# Patient Record
Sex: Male | Born: 1970
Health system: Southern US, Community
[De-identification: ages and names within clinical notes are randomized; demographics above are authoritative.]

## PROBLEM LIST (undated history)

## (undated) DIAGNOSIS — T7840XA Allergy, unspecified, initial encounter: Secondary | ICD-10-CM

## (undated) DIAGNOSIS — E079 Disorder of thyroid, unspecified: Secondary | ICD-10-CM

## (undated) HISTORY — PX: NASAL SINUS SURGERY: SHX719

## (undated) HISTORY — DX: Disorder of thyroid, unspecified: E07.9

## (undated) HISTORY — DX: Allergy, unspecified, initial encounter: T78.40XA

---

## 2006-11-19 ENCOUNTER — Ambulatory Visit: Payer: Self-pay | Admitting: Internal Medicine

## 2007-01-07 ENCOUNTER — Encounter: Admission: RE | Admit: 2007-01-07 | Discharge: 2007-01-07 | Payer: Self-pay | Admitting: Orthopedic Surgery

## 2007-08-10 ENCOUNTER — Ambulatory Visit: Payer: Self-pay | Admitting: Internal Medicine

## 2007-08-31 DIAGNOSIS — E785 Hyperlipidemia, unspecified: Secondary | ICD-10-CM | POA: Insufficient documentation

## 2007-08-31 DIAGNOSIS — Z872 Personal history of diseases of the skin and subcutaneous tissue: Secondary | ICD-10-CM | POA: Insufficient documentation

## 2008-11-23 ENCOUNTER — Ambulatory Visit: Payer: Self-pay | Admitting: Vascular Surgery

## 2010-07-03 ENCOUNTER — Ambulatory Visit
Admission: RE | Admit: 2010-07-03 | Discharge: 2010-07-03 | Disposition: A | Discharge status: Home / Self Care | Payer: BC Managed Care – PPO | Source: Ambulatory Visit | Attending: Endocrinology | Admitting: Endocrinology

## 2010-07-03 ENCOUNTER — Other Ambulatory Visit: Payer: Self-pay | Admitting: Endocrinology

## 2010-09-23 NOTE — Assessment & Plan Note (Signed)
[facility]                         GASTROENTEROLOGY OFFICE NOTE   NAME:Corey Simmons, Corey Simmons                        MRN:          [id number]  DATE:11/19/2006                            DOB:          [birth year]    The patient is self referred.   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  First, the patient states  developing significant problems with regurgitation and substernal  burning about 5 years ago.  For this he was placed on proton pump  inhibitors without improvement.  Subsequently, he took Zantac 300 mg  daily.  This was helpful.  He has subsequently weaned himself to 75 mg  daily over the past 2 years.  He has absolutely no heartburn,  indigestion, or dysphagia.  He is wondering if he could come off the  medication.  No family history of esophageal disorders or esophageal  cancer.  No abdominal pain.  Second, the patient reports about 6 weeks  ago passing a hard bowel movement.  Thereafter, severe rectal pain and  some bleeding.  The pain lasted several days.  It made it difficult to  sit.  He subsequently began to treat himself with Preparation-H, medical  wipes, and sitz baths, as well AnaMantle cream.  Since that time, he has  had gradual improvement in symptoms.  There are still some mild  intermittent symptoms.  He is wondering what to do.   PAST MEDICAL HISTORY:  Dyslipidemia.   PAST SURGICAL HISTORY:  None.   ALLERGIES:  No known drug allergies.   CURRENT MEDICATIONS:  1. Zantac 75 mg daily.  2. Fish oil supplement.  3. AnaMantle cream.  4. Claritin-D p.r.n.   FAMILY HISTORY:  Father with colon polyps diagnosed in his mid 61s.  Father also with a history of heart disease.   SOCIAL HISTORY:  The patient is married with a 23.54-year-old son.  He  lives with is wife.  He has a Probation officer.  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  He does not smoke.  He  occasionally uses alcohol.   REVIEW OF SYSTEMS:  Per diagnostic evaluation form.   PHYSICAL EXAMINATION:  GENERAL:  A well-appearing male in no acute  distress.  VITAL SIGNS:  Blood pressure is 114/72, heart rate 66, weight is 179  pounds.  He is 6 feet 1 inch in height.  HEENT:  Sclerae are anicteric.  Conjunctivae are pink.  Oral mucosa  intact.  No adenopathy.  LUNGS:  Clear.  HEART:  Regular.  ABDOMEN:  Soft without tenderness, mass, or hernia.  RECTAL:  Reveals no external abnormalities.  No hemorrhoids.  He does  have a posterior fissure.  EXTREMITIES:  Without edema.   IMPRESSION:  1. Possible prior history of reflux disease.  Currently asymptomatic      on low dose Zantac.  2. Recent problems with rectal pain and bleeding due to posterior anal      fissure.   RECOMMENDATIONS:  1. The patient may discontinue 75 mg of Zantac and use the medication      on demand should he have breakthrough symptoms, otherwise adherence      to reflux precautions would be in order.  2. For his fissure, he should continue with local therapies until      asymptomatic for one week then discontinue.  In the interim though,      I have asked him to take a daily fiber supplement such as Metamucil      to improve the consistency of stools and hopefully avoid re-      aggravation of the fissure in the future.     Wilhemina Bonito. Marina Goodell, MD  Electronically Signed    JNP/MedQ  DD: 11/19/2006  DT: 11/19/2006  Job #: [id number]   cc:   Kari Baars, M.D.

## 2010-09-23 NOTE — Assessment & Plan Note (Signed)
[facility]                         GASTROENTEROLOGY OFFICE NOTE   NAME:[name], [name]                        MRN:          [id number]  DATE:08/10/2007                            DOB:          1970-07-10    HISTORY:  Corey Simmons presents today regarding rectal bleeding.  He was  evaluated in the office initially November 19, 2006 for rectal pain and  bleeding.  He was found to have an obvious posterior anal fissure.  He  was treated medically and his problem resolved.  However, about two  months ago he became constipated, passed a hard stool and developed  recurrent bleeding.  He had fairly regular bleeding with bowel movements  and then resumed medical therapy including sitz baths and hemorrhoid  cream.  As well, increased his oral fiber intake.  Currently he is only  noticing trivial amounts of blood on the tissue.  No pain.  No other  issues.  He felt compelled to have this problem evaluated.   PHYSICAL EXAMINATION:  Well appearing male in no acute distress.  Blood  pressure is 120/74, heart rate is 68, weight is 179 pounds.  His abdomen  is entirely benign.  Rectal exam reveals obvious posterior fissure.   IMPRESSION:  Current rectal bleeding due to posterior anal fissure.   RECOMMENDATIONS:  1. Continue fiber.  2. Continue local therapies.  3. If problem were to become recurrent with great frequency or      persistent, then consider surgical therapy.     Wilhemina Bonito. Marina Goodell, MD  Electronically Signed    JNP/MedQ  DD: 08/10/2007  DT: 08/10/2007  Job #: [id number]

## 2010-09-23 NOTE — Procedures (Signed)
DUPLEX DEEP VENOUS EXAM - LOWER EXTREMITY   INDICATION:  Bilateral calf pain.   HISTORY:  Edema:  No.  Trauma/Surgery:  No.  Pain:  Yes.  PE:  No.  Previous DVT:  None.  Anticoagulants:  None.  Other:   DUPLEX EXAM:                CFV   SFV   PopV  PTV    GSV                R  L  R  L  R  L  R   L  R  L  Thrombosis    0  0  0  0  0  0  0   0  0  0  Spontaneous   +  +  +  +  +  +  +   +  +  +  Phasic        +  +  +  +  +  +  +   +  +  +  Augmentation  +  +  +  +  +  +  +   +  +  +  Compressible  +  +  +  +  +  +  +   +  +  +  Competent     +  +  +  +  +  +  +   +  +  +   Legend:  + - yes  o - no  p - partial  D - decreased   IMPRESSION:  1. No evidence of DVT noted in bilateral legs.  2. Thrombus noted in the left lesser saphenous vein.  3. Notify Dr. Clelia Croft with results.    _____________________________  Di Kindle. Edilia Bo, M.D.   MG/MEDQ  D:  11/23/2008  T:  11/23/2008  Job:  [id number]

## 2015-06-01 ENCOUNTER — Encounter (HOSPITAL_COMMUNITY): Payer: Self-pay | Admitting: *Deleted

## 2015-06-01 ENCOUNTER — Emergency Department (HOSPITAL_COMMUNITY)
Admission: EM | Admit: 2015-06-01 | Discharge: 2015-06-01 | Disposition: A | Discharge status: Home / Self Care | Payer: BLUE CROSS/BLUE SHIELD | Source: Home / Self Care

## 2015-06-01 DIAGNOSIS — T700XXA Otitic barotrauma, initial encounter: Secondary | ICD-10-CM | POA: Diagnosis not present

## 2015-06-01 DIAGNOSIS — R0981 Nasal congestion: Secondary | ICD-10-CM

## 2015-06-01 MED ORDER — CEFDINIR 300 MG PO CAPS: 300.0000 mg | ORAL_CAPSULE | Freq: Two times a day (BID) | ORAL | Status: DC

## 2015-06-01 MED ORDER — LORATADINE-PSEUDOEPHEDRINE ER 10-240 MG PO TB24: 1.0000 | ORAL_TABLET | Freq: Every day | ORAL | Status: AC

## 2015-06-01 MED ORDER — PREDNISONE 50 MG PO TABS: 50.0000 mg | ORAL_TABLET | Freq: Every day | ORAL | Status: DC

## 2015-06-01 NOTE — ED Provider Notes (Signed)
CSN: [id number]     Arrival date & time 06/01/15  1740 History   None    Chief Complaint  Patient presents with  . URI   HPI  Patient is a 45 year old gentleman with past history of chronic sinusitis. He has been traveling a lot, and is at increased congestion and drainage for about the last week. He flew into the area last evening from [place], and has discomfort and a blocked sensation in his right ear, cannot hear out of it. He is very uncomfortable. He is supposed to fly out tomorrow on a transatlantic multi-country trip.  History reviewed. No pertinent past medical history. History reviewed. No pertinent past surgical history. History reviewed. No pertinent family history. Social History  Substance Use Topics  . Smoking status: None  . Smokeless tobacco: None  . Alcohol Use: None    Review of Systems  All other systems reviewed and are negative.   Allergies  Review of patient's allergies indicates no known allergies.  Home Medications   Prior to Admission medications   Medication Sig Start Date End Date Taking? Authorizing Provider  Fexofenadine HCl (ALLEGRA PO) Take by mouth.   Yes Historical Provider, MD  Levothyroxine Sodium (SYNTHROID PO) Take by mouth.   Yes Historical Provider, MD  Montelukast Sodium (SINGULAIR PO) Take by mouth.   Yes Historical Provider, MD  cefdinir (OMNICEF) 300 MG capsule Take 1 capsule (300 mg total) by mouth 2 (two) times daily. 06/01/15   Sherlene Shams, MD  loratadine-pseudoephedrine (CLARITIN-D 24 HOUR) 10-240 MG 24 hr tablet Take 1 tablet by mouth daily. 06/01/15 06/11/15  Sherlene Shams, MD  predniSONE (DELTASONE) 50 MG tablet Take 1 tablet (50 mg total) by mouth daily. 06/01/15   Sherlene Shams, MD   Meds Ordered and Administered this Visit  Medications - No data to display  BP 133/90 mmHg  Pulse 71  Temp(Src) 98.2 F (36.8 C) (Oral)  SpO2 98% No data found.   Physical Exam  Constitutional: He is oriented to person, place, and  time. No distress.  Alert, nicely groomed Voice sounds congested  HENT:  Head: Atraumatic.  Right TM is translucent but moderately erythematous, Left TM is quite dull Moderate nasal congestion with mucopurulent material present Throat is red  Eyes:  Conjugate gaze, no eye redness/drainage  Neck: Neck supple.  Cardiovascular: Normal rate and regular rhythm.   Pulmonary/Chest: No respiratory distress. He has no wheezes. He has no rales.  Lungs clear, symmetric breath sounds  Abdominal: He exhibits no distension.  Musculoskeletal: Normal range of motion.  Neurological: He is alert and oriented to person, place, and time.  Skin: Skin is warm and dry.  No cyanosis  Nursing note and vitals reviewed.   ED Course  Procedures (including critical care time)  None  MDM   1. Barotrauma, otic, initial encounter   2. Sinus congestion    New Prescriptions   CEFDINIR (OMNICEF) 300 MG CAPSULE    Take 1 capsule (300 mg total) by mouth 2 (two) times daily.   LORATADINE-PSEUDOEPHEDRINE (CLARITIN-D 24 HOUR) 10-240 MG 24 HR TABLET    Take 1 tablet by mouth daily.   PREDNISONE (DELTASONE) 50 MG TABLET    Take 1 tablet (50 mg total) by mouth daily.   Trial of prednisone, Claritin-D, Afrin. If not improving in the morning, consider consulting with ENT coverage before departure.  Prescription for Omnicef given in case he is able to travel, and sinus drainage and congestion worsen.  Sherlene Shams, MD 06/01/15 [phone]

## 2015-06-01 NOTE — ED Notes (Signed)
Pt  Reports     He     Has     Sinus   Congestion     And drainage     Recent     Air  Travel   He  Reports  Has   Sensation of  r  Ear  Being  Blocked

## 2015-06-01 NOTE — Discharge Instructions (Signed)
Prescriptions for prednisone, claritin-D, and omnicef were printed. If not improved in the morning, consider a phone consult with your ENT coverage before traveling. If you are traveling, afrin before take-off may be helpful.  Ear Barotrauma  Ear barotrauma is injury to the eardrum that is caused by a pressure difference between the inside and the outside of the eardrum. CAUSES  The pressure difference that causes ear barotrauma can result from various things, including:  Flying in an airplane.  Coming to the surface too quickly after scuba diving.  Going to higher altitudes quickly.  Having an ear infection.  Being too close to an explosion or blast.  Receiving a hard hit to your ear.  Undergoing rapid depressurization after working in a pressurized chamber.  Having inflammation of your middle ear (barotitis media) caused by a blockage of the auditory tube (eustachian tube), which leads from the back of your nose to your eardrum. The blockage can result from a cold, an environmental allergy, or an upper respiratory infection. SIGNS AND SYMPTOMS  Feeling that your ear is clogged.  Dizziness that feels like a spinning, rocking, or tumbling sensation.  Loss of balance.  Nausea.  Ringing in your ears.  Ear pain.  Bleeding from your ears.  Sudden partial or complete loss of hearing.  Headache. DIAGNOSIS Your health care provider may know what is wrong based on your medical history and your recent activity. A physical exam will be done, including a close exam of your ear. Blood tests and imaging tests may be done to check for other injuries. Hearing and balance tests may also be done. This may be more important if the ear barotrauma happened while scuba diving or if you traveled to a high altitude after diving. TREATMENT Treatment depends on the severity of the injury to your ear or ears. Home care techniques may be recommended to help equalize pressure changes. These may  include chewing gum or "popping" your ears. A decongestant may be used to help reduce congestion. If there is a risk for infection, you may be given antibiotic medicine. With severe injuries, you may need to see a specialist. Surgery is sometimes needed, but this is rare. HOME CARE INSTRUCTIONS  Take medicines only as directed by your health care provider.  Do not do any of the following until your health care provider approves:  Travel to high altitudes.  Work in a Pension scheme manager or room.  Scuba dive.  Use techniques to help equalize pressure changes in your ear as recommended by your health care provider. These may include:  Chewing gum.  Frequent, forceful swallowing.  Holding your nose and gently blowing to pop your ears.  Keep your ears dry. Use the corner of a towel to get water out of your ears after showering or bathing.  Keep all follow-up visits as directed by your health care provider. This is important. SEEK MEDICAL CARE IF:  Your symptoms do not improve, or they become worse.  You have a fever.  Your symptoms involve only one ear. SEEK IMMEDIATE MEDICAL CARE IF:  You develop a severe headache, dizziness, or severe ear pain.  You have bloody or puslike drainage from your ears.  You have hearing loss.  Your outer ear becomes red or swollen, or there is swelling behind your earlobe.   This information is not intended to replace advice given to you by your health care provider. Make sure you discuss any questions you have with your health care provider.  Document Released: 03/30/2006 Document Revised: 05/18/2014 Document Reviewed: 12/11/2013 Elsevier Interactive Patient Education [employer].

## 2015-07-04 ENCOUNTER — Emergency Department (HOSPITAL_COMMUNITY)
Admission: EM | Admit: 2015-07-04 | Discharge: 2015-07-04 | Disposition: A | Discharge status: Home / Self Care | Payer: Managed Care, Other (non HMO) | Source: Home / Self Care | Attending: Emergency Medicine | Admitting: Emergency Medicine

## 2015-07-04 ENCOUNTER — Encounter (HOSPITAL_COMMUNITY): Payer: Self-pay | Admitting: Emergency Medicine

## 2015-07-04 DIAGNOSIS — J018 Other acute sinusitis: Secondary | ICD-10-CM | POA: Diagnosis not present

## 2015-07-04 MED ORDER — PREDNISONE 50 MG PO TABS: ORAL_TABLET | ORAL | Status: DC

## 2015-07-04 MED ORDER — CEFDINIR 300 MG PO CAPS: 300.0000 mg | ORAL_CAPSULE | Freq: Two times a day (BID) | ORAL | Status: DC

## 2015-07-04 NOTE — ED Notes (Signed)
Pt was seen one month ago for ear issues.  He was instructed not to fly, and so did not go to [place].  He is scheduled to fly to [place] tomorrow and would like to be sure he is safe to fly.  Pt reports some post nasal drip and is concerned for the ear.

## 2015-07-04 NOTE — Discharge Instructions (Signed)
Right now, everything looks good. Continue your allergy medications as well as the Sudafed and Mucinex. We'll add a short course of prednisone to help with the air travel. Only take the antibiotic if you develop fevers or purulent drainage. Follow-up as needed.

## 2015-07-04 NOTE — ED Provider Notes (Signed)
CSN: [id number]     Arrival date & time 07/04/15  1348 History   First MD Initiated Contact with Patient 07/04/15 1511     Chief Complaint  Patient presents with  . Follow-up   (Consider location/radiation/quality/duration/timing/severity/associated sxs/prior Treatment) HPI He is a 45 year old man here for sinus issues. He states he has chronic sinus issues. About a month ago, he was here for purulent sinus drainage with ear pain. At that time is recommended he not travel by air due to potential barotrauma. He took the medications as prescribed and states his symptoms resolved. 2 days ago, he developed a little bit of increased postnasal drainage and nasal congestion. He started taking Sudafed and Mucinex with some improvement. He is here today to check his years as he is scheduled for a transatlantic flight tomorrow. Currently denies any ear pain or difficulty hearing.  He does take Allegra and Singulair daily for sinuses. He also does sinus rinses twice a day.  History reviewed. No pertinent past medical history. History reviewed. No pertinent past surgical history. History reviewed. No pertinent family history. Social History  Substance Use Topics  . Smoking status: Never Smoker   . Smokeless tobacco: None  . Alcohol Use: No    Review of Systems As in history of present illness Allergies  Review of patient's allergies indicates no known allergies.  Home Medications   Prior to Admission medications   Medication Sig Start Date End Date Taking? Authorizing Provider  Fexofenadine HCl (ALLEGRA PO) Take by mouth.   Yes Historical Provider, MD  guaiFENesin (MUCINEX) 600 MG 12 hr tablet Take by mouth 2 (two) times daily.   Yes Historical Provider, MD  Levothyroxine Sodium (SYNTHROID PO) Take by mouth.   Yes Historical Provider, MD  Montelukast Sodium (SINGULAIR PO) Take by mouth.   Yes Historical Provider, MD  phenylephrine (SUDAFED PE) 10 MG TABS tablet Take 10 mg by mouth every 4  (four) hours as needed.   Yes Historical Provider, MD  cefdinir (OMNICEF) 300 MG capsule Take 1 capsule (300 mg total) by mouth 2 (two) times daily. 07/04/15   Melony Overly, MD  predniSONE (DELTASONE) 50 MG tablet Take 1 pill daily for 5 days. 07/04/15   Melony Overly, MD   Meds Ordered and Administered this Visit  Medications - No data to display  BP 114/74 mmHg  Pulse 62  Temp(Src) 98.2 F (36.8 C) (Oral)  Resp 12  SpO2 100% No data found.   Physical Exam  Constitutional: He is oriented to person, place, and time. He appears well-developed and well-nourished.  HENT:  Nose: Nose normal.  Mouth/Throat: No oropharyngeal exudate.  Small amount of clear postnasal drainage. TMs normal bilaterally.  Neck: Neck supple.  Cardiovascular: Normal rate.   Pulmonary/Chest: Effort normal.  Lymphadenopathy:    He has no cervical adenopathy.  Neurological: He is alert and oriented to person, place, and time.    ED Course  Procedures (including critical care time)  Labs Review Labs Reviewed - No data to display  Imaging Review No results found.    MDM   1. Other subacute sinusitis    Recommended that he continue with his typical sinus regimen as well as the Sudafed and Mucinex while he is traveling. We'll give a five-day course of prednisone as he is traveling by air. Prescription given for Omnicef to be filled if he develops fevers or purulent sinus drainage. Follow-up as needed.    Melony Overly, MD 07/04/15 [phone]

## 2017-04-23 DIAGNOSIS — J320 Chronic maxillary sinusitis: Secondary | ICD-10-CM | POA: Insufficient documentation

## 2018-03-07 DIAGNOSIS — R82998 Other abnormal findings in urine: Secondary | ICD-10-CM | POA: Diagnosis not present

## 2018-03-07 DIAGNOSIS — Z0000 Encounter for general adult medical examination without abnormal findings: Secondary | ICD-10-CM | POA: Diagnosis not present

## 2018-03-07 DIAGNOSIS — E038 Other specified hypothyroidism: Secondary | ICD-10-CM | POA: Diagnosis not present

## 2018-03-07 DIAGNOSIS — Z125 Encounter for screening for malignant neoplasm of prostate: Secondary | ICD-10-CM | POA: Diagnosis not present

## 2018-03-14 DIAGNOSIS — J302 Other seasonal allergic rhinitis: Secondary | ICD-10-CM | POA: Diagnosis not present

## 2018-03-14 DIAGNOSIS — E038 Other specified hypothyroidism: Secondary | ICD-10-CM | POA: Diagnosis not present

## 2018-03-14 DIAGNOSIS — J328 Other chronic sinusitis: Secondary | ICD-10-CM | POA: Diagnosis not present

## 2018-03-14 DIAGNOSIS — Z1331 Encounter for screening for depression: Secondary | ICD-10-CM | POA: Diagnosis not present

## 2018-03-14 DIAGNOSIS — E7849 Other hyperlipidemia: Secondary | ICD-10-CM | POA: Diagnosis not present

## 2018-03-14 DIAGNOSIS — Z0000 Encounter for general adult medical examination without abnormal findings: Secondary | ICD-10-CM | POA: Diagnosis not present

## 2018-03-15 ENCOUNTER — Other Ambulatory Visit: Payer: Self-pay | Admitting: Internal Medicine

## 2018-03-15 DIAGNOSIS — E785 Hyperlipidemia, unspecified: Secondary | ICD-10-CM

## 2018-03-21 ENCOUNTER — Ambulatory Visit
Admission: RE | Admit: 2018-03-21 | Discharge: 2018-03-21 | Disposition: A | Discharge status: Home / Self Care | Payer: Managed Care, Other (non HMO) | Source: Ambulatory Visit | Attending: Internal Medicine | Admitting: Internal Medicine

## 2018-03-21 DIAGNOSIS — E785 Hyperlipidemia, unspecified: Secondary | ICD-10-CM

## 2018-03-25 DIAGNOSIS — Z1212 Encounter for screening for malignant neoplasm of rectum: Secondary | ICD-10-CM | POA: Diagnosis not present

## 2018-05-20 DIAGNOSIS — J3489 Other specified disorders of nose and nasal sinuses: Secondary | ICD-10-CM | POA: Diagnosis not present

## 2018-05-20 DIAGNOSIS — M791 Myalgia, unspecified site: Secondary | ICD-10-CM | POA: Diagnosis not present

## 2018-05-20 DIAGNOSIS — R0982 Postnasal drip: Secondary | ICD-10-CM | POA: Diagnosis not present

## 2018-05-20 DIAGNOSIS — J343 Hypertrophy of nasal turbinates: Secondary | ICD-10-CM | POA: Diagnosis not present

## 2018-05-26 DIAGNOSIS — Z021 Encounter for pre-employment examination: Secondary | ICD-10-CM | POA: Diagnosis not present

## 2018-08-17 ENCOUNTER — Encounter: Payer: Self-pay | Admitting: Podiatry

## 2018-08-17 ENCOUNTER — Ambulatory Visit: Payer: BLUE CROSS/BLUE SHIELD | Admitting: Podiatry

## 2018-08-17 ENCOUNTER — Other Ambulatory Visit: Payer: Self-pay | Admitting: Podiatry

## 2018-08-17 ENCOUNTER — Other Ambulatory Visit: Payer: Self-pay

## 2018-08-17 ENCOUNTER — Ambulatory Visit (INDEPENDENT_AMBULATORY_CARE_PROVIDER_SITE_OTHER): Payer: BLUE CROSS/BLUE SHIELD

## 2018-08-17 VITALS — BP 112/76 | HR 68 | Temp 97.3°F | Resp 16

## 2018-08-17 DIAGNOSIS — M7662 Achilles tendinitis, left leg: Secondary | ICD-10-CM

## 2018-08-17 DIAGNOSIS — M79672 Pain in left foot: Secondary | ICD-10-CM

## 2018-08-17 DIAGNOSIS — J0140 Acute pansinusitis, unspecified: Secondary | ICD-10-CM | POA: Insufficient documentation

## 2018-08-17 DIAGNOSIS — M722 Plantar fascial fibromatosis: Secondary | ICD-10-CM

## 2018-08-17 MED ORDER — TRIAMCINOLONE ACETONIDE 10 MG/ML IJ SUSP
10.0000 mg | Freq: Once | INTRAMUSCULAR | Status: AC
  Administered 2018-08-17: 10 mg

## 2018-08-17 NOTE — Patient Instructions (Addendum)
Plantar Fasciitis (Heel Spur Syndrome) with Rehab The plantar fascia is a fibrous, ligament-like, soft-tissue structure that spans the bottom of the foot. Plantar fasciitis is a condition that causes pain in the foot due to inflammation of the tissue. SYMPTOMS   Pain and tenderness on the underneath side of the foot.  Pain that worsens with standing or walking. CAUSES  Plantar fasciitis is caused by irritation and injury to the plantar fascia on the underneath side of the foot. Common mechanisms of injury include:  Direct trauma to bottom of the foot.  Damage to a small nerve that runs under the foot where the main fascia attaches to the heel bone.  Stress placed on the plantar fascia due to bone spurs. RISK INCREASES WITH:   Activities that place stress on the plantar fascia (running, jumping, pivoting, or cutting).  Poor strength and flexibility.  Improperly fitted shoes.  Tight calf muscles.  Flat feet.  Failure to warm-up properly before activity.  Obesity. PREVENTION  Warm up and stretch properly before activity.  Allow for adequate recovery between workouts.  Maintain physical fitness:  Strength, flexibility, and endurance.  Cardiovascular fitness.  Maintain a health body weight.  Avoid stress on the plantar fascia.  Wear properly fitted shoes, including arch supports for individuals who have flat feet.  PROGNOSIS  If treated properly, then the symptoms of plantar fasciitis usually resolve without surgery. However, occasionally surgery is necessary.  RELATED COMPLICATIONS   Recurrent symptoms that may result in a chronic condition.  Problems of the lower back that are caused by compensating for the injury, such as limping.  Pain or weakness of the foot during push-off following surgery.  Chronic inflammation, scarring, and partial or complete fascia tear, occurring more often from repeated injections.  TREATMENT  Treatment initially involves the  use of ice and medication to help reduce pain and inflammation. The use of strengthening and stretching exercises may help reduce pain with activity, especially stretches of the Achilles tendon. These exercises may be performed at home or with a therapist. Your caregiver may recommend that you use heel cups of arch supports to help reduce stress on the plantar fascia. Occasionally, corticosteroid injections are given to reduce inflammation. If symptoms persist for greater than 6 months despite non-surgical (conservative), then surgery may be recommended.   MEDICATION   If pain medication is necessary, then nonsteroidal anti-inflammatory medications, such as aspirin and ibuprofen, or other minor pain relievers, such as acetaminophen, are often recommended.  Do not take pain medication within 7 days before surgery.  Prescription pain relievers may be given if deemed necessary by your caregiver. Use only as directed and only as much as you need.  Corticosteroid injections may be given by your caregiver. These injections should be reserved for the most serious cases, because they may only be given a certain number of times.  HEAT AND COLD  Cold treatment (icing) relieves pain and reduces inflammation. Cold treatment should be applied for 10 to 15 minutes every 2 to 3 hours for inflammation and pain and immediately after any activity that aggravates your symptoms. Use ice packs or massage the area with a piece of ice (ice massage).  Heat treatment may be used prior to performing the stretching and strengthening activities prescribed by your caregiver, physical therapist, or athletic trainer. Use a heat pack or soak the injury in warm water.  SEEK IMMEDIATE MEDICAL CARE IF:  Treatment seems to offer no benefit, or the condition worsens.  Any medications   produce adverse side effects.  EXERCISES- RANGE OF MOTION (ROM) AND STRETCHING EXERCISES - Plantar Fasciitis (Heel Spur Syndrome) These exercises  may help you when beginning to rehabilitate your injury. Your symptoms may resolve with or without further involvement from your physician, physical therapist or athletic trainer. While completing these exercises, remember:   Restoring tissue flexibility helps normal motion to return to the joints. This allows healthier, less painful movement and activity.  An effective stretch should be held for at least 30 seconds.  A stretch should never be painful. You should only feel a gentle lengthening or release in the stretched tissue.  RANGE OF MOTION - Toe Extension, Flexion  Sit with your right / left leg crossed over your opposite knee.  Grasp your toes and gently pull them back toward the top of your foot. You should feel a stretch on the bottom of your toes and/or foot.  Hold this stretch for 10 seconds.  Now, gently pull your toes toward the bottom of your foot. You should feel a stretch on the top of your toes and or foot.  Hold this stretch for 10 seconds. Repeat  times. Complete this stretch 3 times per day.   RANGE OF MOTION - Ankle Dorsiflexion, Active Assisted  Remove shoes and sit on a chair that is preferably not on a carpeted surface.  Place right / left foot under knee. Extend your opposite leg for support.  Keeping your heel down, slide your right / left foot back toward the chair until you feel a stretch at your ankle or calf. If you do not feel a stretch, slide your bottom forward to the edge of the chair, while still keeping your heel down.  Hold this stretch for 10 seconds. Repeat 3 times. Complete this stretch 2 times per day.   STRETCH  Gastroc, Standing  Place hands on wall.  Extend right / left leg, keeping the front knee somewhat bent.  Slightly point your toes inward on your back foot.  Keeping your right / left heel on the floor and your knee straight, shift your weight toward the wall, not allowing your back to arch.  You should feel a gentle stretch  in the right / left calf. Hold this position for 10 seconds. Repeat 3 times. Complete this stretch 2 times per day.  STRETCH  Soleus, Standing  Place hands on wall.  Extend right / left leg, keeping the other knee somewhat bent.  Slightly point your toes inward on your back foot.  Keep your right / left heel on the floor, bend your back knee, and slightly shift your weight over the back leg so that you feel a gentle stretch deep in your back calf.  Hold this position for 10 seconds. Repeat 3 times. Complete this stretch 2 times per day.  STRETCH  Gastrocsoleus, Standing  Note: This exercise can place a lot of stress on your foot and ankle. Please complete this exercise only if specifically instructed by your caregiver.   Place the ball of your right / left foot on a step, keeping your other foot firmly on the same step.  Hold on to the wall or a rail for balance.  Slowly lift your other foot, allowing your body weight to press your heel down over the edge of the step.  You should feel a stretch in your right / left calf.  Hold this position for 10 seconds.  Repeat this exercise with a slight bend in your right /   left knee. Repeat 3 times. Complete this stretch 2 times per day.   STRENGTHENING EXERCISES - Plantar Fasciitis (Heel Spur Syndrome)  These exercises may help you when beginning to rehabilitate your injury. They may resolve your symptoms with or without further involvement from your physician, physical therapist or athletic trainer. While completing these exercises, remember:   Muscles can gain both the endurance and the strength needed for everyday activities through controlled exercises.  Complete these exercises as instructed by your physician, physical therapist or athletic trainer. Progress the resistance and repetitions only as guided.  STRENGTH - Towel Curls  Sit in a chair positioned on a non-carpeted surface.  Place your foot on a towel, keeping your heel  on the floor.  Pull the towel toward your heel by only curling your toes. Keep your heel on the floor. Repeat 3 times. Complete this exercise 2 times per day.  STRENGTH - Ankle Inversion  Secure one end of a rubber exercise band/tubing to a fixed object (table, pole). Loop the other end around your foot just before your toes.  Place your fists between your knees. This will focus your strengthening at your ankle.  Slowly, pull your big toe up and in, making sure the band/tubing is positioned to resist the entire motion.  Hold this position for 10 seconds.  Have your muscles resist the band/tubing as it slowly pulls your foot back to the starting position. Repeat 3 times. Complete this exercises 2 times per day.  Document Released: 04/27/2005 Document Revised: 07/20/2011 Document Reviewed: 08/09/2008 [employer] Patient Information 2014 [employer]. Achilles Tendinitis  with Rehab Achilles tendinitis is a disorder of the Achilles tendon. The Achilles tendon connects the large calf muscles (Gastrocnemius and Soleus) to the heel bone (calcaneus). This tendon is sometimes called the heel cord. It is important for pushing-off and standing on your toes and is important for walking, running, or jumping. Tendinitis is often caused by overuse and repetitive microtrauma. SYMPTOMS  Pain, tenderness, swelling, warmth, and redness may occur over the Achilles tendon even at rest.  Pain with pushing off, or flexing or extending the ankle.  Pain that is worsened after or during activity. CAUSES   Overuse sometimes seen with rapid increase in exercise programs or in sports requiring running and jumping.  Poor physical conditioning (strength and flexibility or endurance).  Running sports, especially training running down hills.  Inadequate warm-up before practice or play or failure to stretch before participation.  Injury to the tendon. PREVENTION   Warm up and stretch before practice or  competition.  Allow time for adequate rest and recovery between practices and competition.  Keep up conditioning.  Keep up ankle and leg flexibility.  Improve or keep muscle strength and endurance.  Improve cardiovascular fitness.  Use proper technique.  Use proper equipment (shoes, skates).  To help prevent recurrence, taping, protective strapping, or an adhesive bandage may be recommended for several weeks after healing is complete. PROGNOSIS   Recovery may take weeks to several months to heal.  Longer recovery is expected if symptoms have been prolonged.  Recovery is usually quicker if the inflammation is due to a direct blow as compared with overuse or sudden strain. RELATED COMPLICATIONS   Healing time will be prolonged if the condition is not correctly treated. The injury must be given plenty of time to heal.  Symptoms can reoccur if activity is resumed too soon.  Untreated, tendinitis may increase the risk of tendon rupture requiring additional time for recovery   and possibly surgery. TREATMENT   The first treatment consists of rest anti-inflammatory medication, and ice to relieve the pain.  Stretching and strengthening exercises after resolution of pain will likely help reduce the risk of recurrence. Referral to a physical therapist or athletic trainer for further evaluation and treatment may be helpful.  A walking boot or cast may be recommended to rest the Achilles tendon. This can help break the cycle of inflammation and microtrauma.  Arch supports (orthotics) may be prescribed or recommended by your caregiver as an adjunct to therapy and rest.  Surgery to remove the inflamed tendon lining or degenerated tendon tissue is rarely necessary and has shown less than predictable results. MEDICATION   Nonsteroidal anti-inflammatory medications, such as aspirin and ibuprofen, may be used for pain and inflammation relief. Do not take within 7 days before surgery. Take  these as directed by your caregiver. Contact your caregiver immediately if any bleeding, stomach upset, or signs of allergic reaction occur. Other minor pain relievers, such as acetaminophen, may also be used.  Pain relievers may be prescribed as necessary by your caregiver. Do not take prescription pain medication for longer than 4 to 7 days. Use only as directed and only as much as you need.  Cortisone injections are rarely indicated. Cortisone injections may weaken tendons and predispose to rupture. It is better to give the condition more time to heal than to use them. HEAT AND COLD  Cold is used to relieve pain and reduce inflammation for acute and chronic Achilles tendinitis. Cold should be applied for 10 to 15 minutes every 2 to 3 hours for inflammation and pain and immediately after any activity that aggravates your symptoms. Use ice packs or an ice massage.  Heat may be used before performing stretching and strengthening activities prescribed by your caregiver. Use a heat pack or a warm soak. SEEK MEDICAL CARE IF:  Symptoms get worse or do not improve in 2 weeks despite treatment.  New, unexplained symptoms develop. Drugs used in treatment may produce side effects.  EXERCISES:  RANGE OF MOTION (ROM) AND STRETCHING EXERCISES - Achilles Tendinitis  These exercises may help you when beginning to rehabilitate your injury. Your symptoms may resolve with or without further involvement from your physician, physical therapist or athletic trainer. While completing these exercises, remember:   Restoring tissue flexibility helps normal motion to return to the joints. This allows healthier, less painful movement and activity.  An effective stretch should be held for at least 30 seconds.  A stretch should never be painful. You should only feel a gentle lengthening or release in the stretched tissue.  STRETCH  Gastroc, Standing   Place hands on wall.  Extend right / left leg, keeping the  front knee somewhat bent.  Slightly point your toes inward on your back foot.  Keeping your right / left heel on the floor and your knee straight, shift your weight toward the wall, not allowing your back to arch.  You should feel a gentle stretch in the right / left calf. Hold this position for 10 seconds. Repeat 3 times. Complete this stretch 2 times per day.  STRETCH  Soleus, Standing   Place hands on wall.  Extend right / left leg, keeping the other knee somewhat bent.  Slightly point your toes inward on your back foot.  Keep your right / left heel on the floor, bend your back knee, and slightly shift your weight over the back leg so that you feel a   gentle stretch deep in your back calf.  Hold this position for 10 seconds. Repeat 3 times. Complete this stretch 2 times per day.  STRETCH  Gastrocsoleus, Standing  Note: This exercise can place a lot of stress on your foot and ankle. Please complete this exercise only if specifically instructed by your caregiver.   Place the ball of your right / left foot on a step, keeping your other foot firmly on the same step.  Hold on to the wall or a rail for balance.  Slowly lift your other foot, allowing your body weight to press your heel down over the edge of the step.  You should feel a stretch in your right / left calf.  Hold this position for 10 seconds.  Repeat this exercise with a slight bend in your knee. Repeat 3 times. Complete this stretch 2 times per day.   STRENGTHENING EXERCISES - Achilles Tendinitis These exercises may help you when beginning to rehabilitate your injury. They may resolve your symptoms with or without further involvement from your physician, physical therapist or athletic trainer. While completing these exercises, remember:   Muscles can gain both the endurance and the strength needed for everyday activities through controlled exercises.  Complete these exercises as instructed by your physician,  physical therapist or athletic trainer. Progress the resistance and repetitions only as guided.  You may experience muscle soreness or fatigue, but the pain or discomfort you are trying to eliminate should never worsen during these exercises. If this pain does worsen, stop and make certain you are following the directions exactly. If the pain is still present after adjustments, discontinue the exercise until you can discuss the trouble with your clinician.  STRENGTH - Plantar-flexors   Sit with your right / left leg extended. Holding onto both ends of a rubber exercise band/tubing, loop it around the ball of your foot. Keep a slight tension in the band.  Slowly push your toes away from you, pointing them downward.  Hold this position for 10 seconds. Return slowly, controlling the tension in the band/tubing. Repeat 3 times. Complete this exercise 2 times per day.   STRENGTH - Plantar-flexors   Stand with your feet shoulder width apart. Steady yourself with a wall or table using as little support as needed.  Keeping your weight evenly spread over the width of your feet, rise up on your toes.*  Hold this position for 10 seconds. Repeat 3 times. Complete this exercise 2 times per day.  *If this is too easy, shift your weight toward your right / left leg until you feel challenged. Ultimately, you may be asked to do this exercise with your right / left foot only.  STRENGTH  Plantar-flexors, Eccentric  Note: This exercise can place a lot of stress on your foot and ankle. Please complete this exercise only if specifically instructed by your caregiver.   Place the balls of your feet on a step. With your hands, use only enough support from a wall or rail to keep your balance.  Keep your knees straight and rise up on your toes.  Slowly shift your weight entirely to your right / left toes and pick up your opposite foot. Gently and with controlled movement, lower your weight through your right /  left foot so that your heel drops below the level of the step. You will feel a slight stretch in the back of your calf at the end position.  Use the healthy leg to help rise up onto   the balls of both feet, then lower weight only on the right / left leg again. Build up to 15 repetitions. Then progress to 3 consecutive sets of 15 repetitions.*  After completing the above exercise, complete the same exercise with a slight knee bend (about 30 degrees). Again, build up to 15 repetitions. Then progress to 3 consecutive sets of 15 repetitions.* Perform this exercise 2 times per day.  *When you easily complete 3 sets of 15, your physician, physical therapist or athletic trainer may advise you to add resistance by wearing a backpack filled with additional weight.  STRENGTH - Plantar Flexors, Seated   Sit on a chair that allows your feet to rest flat on the ground. If necessary, sit at the edge of the chair.  Keeping your toes firmly on the ground, lift your right / left heel as far as you can without increasing any discomfort in your ankle. Repeat 3 times. Complete this exercise 2 times a day.  

## 2018-08-17 NOTE — Progress Notes (Signed)
   Subjective:    Patient ID: Corey Simmons, male    DOB: [birth year], 48 y.o.   MRN: [id number]  HPI    Review of Systems  All other systems reviewed and are negative.      Objective:   Physical Exam        Assessment & Plan:

## 2018-08-18 NOTE — Progress Notes (Signed)
Subjective:   Patient ID: Corey Simmons, male   DOB: 48 y.o.   MRN: [id number]   HPI Patient presents stating he has had quite a bit of pain in his left heel for the last few months and he has changed his work habits and is having to carry quite a bit of weight which is contributory.  States that he has been wearing a Spenco insole which may be helping a little bit and the pain seems to be more related to activity.  Patient does not smoke likes to be active   Review of Systems  All other systems reviewed and are negative.       Objective:  Physical Exam Vitals signs and nursing note reviewed.  Constitutional:      Appearance: He is well-developed.  Pulmonary:     Effort: Pulmonary effort is normal.  Musculoskeletal: Normal range of motion.  Skin:    General: Skin is warm.  Neurological:     Mental Status: He is alert.     Neurovascular status found to be intact muscle strength adequate range of motion within normal limits with patient found to have quite a bit of discomfort in the plantar heel left but states that he has been inactive recently so it is not been as bad.  Patient also has some issues with the Achilles tendon and states that that can be at times mildly symptomatic.  Patient has good digital perfusion well oriented x3     Assessment:  Inflammatory fasciitis plantar aspect left heel with probable compensatory Achilles tendinitis with possibility for spur formation     Plan:  H&P condition reviewed and today we will get a focus on the plantar fascia.  I reviewed x-ray and today I injected the plantar tendon 3 mg Kenalog 5 mg Xylocaine and applied a fascial brace with instructions on usage.  Patient will utilize good supportive shoes with heel lift and will be seen back 2 weeks and was given instructions on exercises  X-rays indicate that there is posterior spur formation is quite large but no indications of plantar fascial symptomatology as far as further goes or  calcaneal fracture

## 2018-08-22 ENCOUNTER — Other Ambulatory Visit: Payer: Self-pay

## 2018-08-22 ENCOUNTER — Encounter: Payer: Self-pay | Admitting: Podiatry

## 2018-08-22 ENCOUNTER — Ambulatory Visit: Payer: BLUE CROSS/BLUE SHIELD | Admitting: Podiatry

## 2018-08-22 VITALS — Temp 97.5°F

## 2018-08-22 DIAGNOSIS — M722 Plantar fascial fibromatosis: Secondary | ICD-10-CM | POA: Diagnosis not present

## 2018-08-22 DIAGNOSIS — M7662 Achilles tendinitis, left leg: Secondary | ICD-10-CM | POA: Diagnosis not present

## 2018-08-22 NOTE — Progress Notes (Signed)
Subjective:   Patient ID: Corey Simmons, male   DOB: 48 y.o.   MRN: [id number]   HPI Patient presents stating that he is developed a lot of redness in the back of his left heel and he is concerned about it.  States is plantar heels doing better   ROS      Objective:  Physical Exam  Neurovascular status intact with patient found to have area of redness in the posterior aspect left heel with what appears to be a small indentation in the more proximal portion     Assessment:  Possibility for bug bite as he was out in his yard for numerous hours it may have occurred     Plan:  H&P reviewed condition and recommended soaks with Epson salts and monitoring if it were to get worse we will get a need to consider biopsy or other treatment plan

## 2018-09-26 DIAGNOSIS — Z1159 Encounter for screening for other viral diseases: Secondary | ICD-10-CM | POA: Diagnosis not present

## 2019-01-12 DIAGNOSIS — Z23 Encounter for immunization: Secondary | ICD-10-CM | POA: Diagnosis not present

## 2019-02-14 DIAGNOSIS — Z85828 Personal history of other malignant neoplasm of skin: Secondary | ICD-10-CM | POA: Diagnosis not present

## 2019-02-14 DIAGNOSIS — C44519 Basal cell carcinoma of skin of other part of trunk: Secondary | ICD-10-CM | POA: Diagnosis not present

## 2019-02-14 DIAGNOSIS — L814 Other melanin hyperpigmentation: Secondary | ICD-10-CM | POA: Diagnosis not present

## 2019-02-14 DIAGNOSIS — D225 Melanocytic nevi of trunk: Secondary | ICD-10-CM | POA: Diagnosis not present

## 2019-02-14 DIAGNOSIS — L570 Actinic keratosis: Secondary | ICD-10-CM | POA: Diagnosis not present

## 2019-02-14 DIAGNOSIS — D171 Benign lipomatous neoplasm of skin and subcutaneous tissue of trunk: Secondary | ICD-10-CM | POA: Diagnosis not present

## 2019-03-22 ENCOUNTER — Ambulatory Visit: Payer: BLUE CROSS/BLUE SHIELD | Admitting: Podiatry

## 2019-03-22 ENCOUNTER — Other Ambulatory Visit: Payer: Self-pay

## 2019-03-22 ENCOUNTER — Encounter: Payer: Self-pay | Admitting: Podiatry

## 2019-03-22 DIAGNOSIS — Z0000 Encounter for general adult medical examination without abnormal findings: Secondary | ICD-10-CM | POA: Diagnosis not present

## 2019-03-22 DIAGNOSIS — M722 Plantar fascial fibromatosis: Secondary | ICD-10-CM | POA: Diagnosis not present

## 2019-03-22 DIAGNOSIS — Z125 Encounter for screening for malignant neoplasm of prostate: Secondary | ICD-10-CM | POA: Diagnosis not present

## 2019-03-22 DIAGNOSIS — E038 Other specified hypothyroidism: Secondary | ICD-10-CM | POA: Diagnosis not present

## 2019-03-22 DIAGNOSIS — E7849 Other hyperlipidemia: Secondary | ICD-10-CM | POA: Diagnosis not present

## 2019-03-22 NOTE — Progress Notes (Signed)
Subjective:   Patient ID: Corey Simmons, male   DOB: 48 y.o.   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  I did sterile prep and injected the fascia 3 mg Kenalog 5 mg Xylocaine and then went ahead today and applied air fracture walker to reduce all pressure on the plantar heel

## 2019-03-24 DIAGNOSIS — J302 Other seasonal allergic rhinitis: Secondary | ICD-10-CM | POA: Diagnosis not present

## 2019-03-24 DIAGNOSIS — M722 Plantar fascial fibromatosis: Secondary | ICD-10-CM | POA: Diagnosis not present

## 2019-03-24 DIAGNOSIS — L723 Sebaceous cyst: Secondary | ICD-10-CM | POA: Diagnosis not present

## 2019-03-24 DIAGNOSIS — Z0000 Encounter for general adult medical examination without abnormal findings: Secondary | ICD-10-CM | POA: Diagnosis not present

## 2019-03-24 DIAGNOSIS — E785 Hyperlipidemia, unspecified: Secondary | ICD-10-CM | POA: Diagnosis not present

## 2019-03-24 DIAGNOSIS — Z1331 Encounter for screening for depression: Secondary | ICD-10-CM | POA: Diagnosis not present

## 2019-04-03 DIAGNOSIS — M25562 Pain in left knee: Secondary | ICD-10-CM | POA: Insufficient documentation

## 2019-04-05 DIAGNOSIS — M25562 Pain in left knee: Secondary | ICD-10-CM | POA: Diagnosis not present

## 2019-04-05 DIAGNOSIS — M2242 Chondromalacia patellae, left knee: Secondary | ICD-10-CM | POA: Diagnosis not present

## 2019-04-19 DIAGNOSIS — D1722 Benign lipomatous neoplasm of skin and subcutaneous tissue of left arm: Secondary | ICD-10-CM | POA: Diagnosis not present

## 2019-04-21 ENCOUNTER — Ambulatory Visit: Payer: BC Managed Care – PPO | Admitting: Podiatry

## 2019-04-21 ENCOUNTER — Encounter: Payer: Self-pay | Admitting: Podiatry

## 2019-04-21 ENCOUNTER — Other Ambulatory Visit: Payer: Self-pay

## 2019-04-21 DIAGNOSIS — M722 Plantar fascial fibromatosis: Secondary | ICD-10-CM

## 2019-04-24 NOTE — Progress Notes (Signed)
Subjective:   Patient ID: Corey Simmons, male   DOB: 48 y.o.   MRN: [id number]   HPI Patient states the left heel seems to be feeling quite a bit better in the boot seems to have made a big difference and I have reduced my exercise but would like to start again   ROS      Objective:  Physical Exam  Neurovascular status intact with patient's left heel significantly reduced and discomfort with mild pain only upon deep palpation and overall doing very well     Assessment:  Inflammatory fasciitis left which seems to be improving with immobilization     Plan:  Reviewed immobilization and I have recommended at this point utilization of boot to be utilized for the next 4 weeks on a slow reduction schedule with increase in exercise and if symptoms were to recur we will have to address them at that time

## 2019-05-02 DIAGNOSIS — Z1212 Encounter for screening for malignant neoplasm of rectum: Secondary | ICD-10-CM | POA: Diagnosis not present

## 2019-05-22 DIAGNOSIS — Z111 Encounter for screening for respiratory tuberculosis: Secondary | ICD-10-CM | POA: Diagnosis not present

## 2019-06-06 ENCOUNTER — Other Ambulatory Visit: Payer: Self-pay

## 2019-06-08 DIAGNOSIS — J320 Chronic maxillary sinusitis: Secondary | ICD-10-CM | POA: Diagnosis not present

## 2019-06-16 DIAGNOSIS — H0288A Meibomian gland dysfunction right eye, upper and lower eyelids: Secondary | ICD-10-CM | POA: Diagnosis not present

## 2019-06-16 DIAGNOSIS — H0102B Squamous blepharitis left eye, upper and lower eyelids: Secondary | ICD-10-CM | POA: Diagnosis not present

## 2019-06-16 DIAGNOSIS — H0288B Meibomian gland dysfunction left eye, upper and lower eyelids: Secondary | ICD-10-CM | POA: Diagnosis not present

## 2019-06-16 DIAGNOSIS — H16223 Keratoconjunctivitis sicca, not specified as Sjogren's, bilateral: Secondary | ICD-10-CM | POA: Diagnosis not present

## 2019-07-20 DIAGNOSIS — J320 Chronic maxillary sinusitis: Secondary | ICD-10-CM | POA: Diagnosis not present

## 2019-07-20 DIAGNOSIS — R0683 Snoring: Secondary | ICD-10-CM | POA: Insufficient documentation

## 2019-07-20 DIAGNOSIS — J343 Hypertrophy of nasal turbinates: Secondary | ICD-10-CM | POA: Diagnosis not present

## 2019-08-02 ENCOUNTER — Other Ambulatory Visit (HOSPITAL_BASED_OUTPATIENT_CLINIC_OR_DEPARTMENT_OTHER): Payer: Self-pay

## 2019-08-02 DIAGNOSIS — R5383 Other fatigue: Secondary | ICD-10-CM

## 2019-08-07 DIAGNOSIS — E038 Other specified hypothyroidism: Secondary | ICD-10-CM | POA: Diagnosis not present

## 2019-08-25 DIAGNOSIS — L723 Sebaceous cyst: Secondary | ICD-10-CM | POA: Diagnosis not present

## 2019-08-25 DIAGNOSIS — R5383 Other fatigue: Secondary | ICD-10-CM | POA: Diagnosis not present

## 2019-08-25 DIAGNOSIS — E039 Hypothyroidism, unspecified: Secondary | ICD-10-CM | POA: Diagnosis not present

## 2019-08-25 DIAGNOSIS — M722 Plantar fascial fibromatosis: Secondary | ICD-10-CM | POA: Diagnosis not present

## 2019-08-28 DIAGNOSIS — L821 Other seborrheic keratosis: Secondary | ICD-10-CM | POA: Diagnosis not present

## 2019-08-28 DIAGNOSIS — Z85828 Personal history of other malignant neoplasm of skin: Secondary | ICD-10-CM | POA: Diagnosis not present

## 2019-08-28 DIAGNOSIS — L814 Other melanin hyperpigmentation: Secondary | ICD-10-CM | POA: Diagnosis not present

## 2019-08-28 DIAGNOSIS — D171 Benign lipomatous neoplasm of skin and subcutaneous tissue of trunk: Secondary | ICD-10-CM | POA: Diagnosis not present

## 2019-09-27 DIAGNOSIS — D171 Benign lipomatous neoplasm of skin and subcutaneous tissue of trunk: Secondary | ICD-10-CM | POA: Diagnosis not present

## 2019-09-27 DIAGNOSIS — D1722 Benign lipomatous neoplasm of skin and subcutaneous tissue of left arm: Secondary | ICD-10-CM | POA: Diagnosis not present

## 2019-10-05 DIAGNOSIS — M79672 Pain in left foot: Secondary | ICD-10-CM | POA: Diagnosis not present

## 2019-10-11 DIAGNOSIS — M79672 Pain in left foot: Secondary | ICD-10-CM | POA: Diagnosis not present

## 2019-10-13 ENCOUNTER — Other Ambulatory Visit: Payer: Self-pay

## 2019-10-13 ENCOUNTER — Ambulatory Visit (HOSPITAL_BASED_OUTPATIENT_CLINIC_OR_DEPARTMENT_OTHER): Payer: BC Managed Care – PPO | Attending: Otolaryngology | Admitting: Internal Medicine

## 2019-10-13 DIAGNOSIS — M79672 Pain in left foot: Secondary | ICD-10-CM | POA: Diagnosis not present

## 2019-10-13 DIAGNOSIS — R5383 Other fatigue: Secondary | ICD-10-CM | POA: Diagnosis not present

## 2019-10-17 DIAGNOSIS — M79672 Pain in left foot: Secondary | ICD-10-CM | POA: Diagnosis not present

## 2019-10-20 DIAGNOSIS — M79672 Pain in left foot: Secondary | ICD-10-CM | POA: Diagnosis not present

## 2019-10-20 DIAGNOSIS — J3081 Allergic rhinitis due to animal (cat) (dog) hair and dander: Secondary | ICD-10-CM | POA: Diagnosis not present

## 2019-10-20 DIAGNOSIS — J3089 Other allergic rhinitis: Secondary | ICD-10-CM | POA: Diagnosis not present

## 2019-10-20 DIAGNOSIS — H1045 Other chronic allergic conjunctivitis: Secondary | ICD-10-CM | POA: Diagnosis not present

## 2019-10-20 DIAGNOSIS — M25561 Pain in right knee: Secondary | ICD-10-CM | POA: Diagnosis not present

## 2019-10-20 DIAGNOSIS — M238X1 Other internal derangements of right knee: Secondary | ICD-10-CM | POA: Diagnosis not present

## 2019-10-20 DIAGNOSIS — J301 Allergic rhinitis due to pollen: Secondary | ICD-10-CM | POA: Diagnosis not present

## 2019-10-25 DIAGNOSIS — J3081 Allergic rhinitis due to animal (cat) (dog) hair and dander: Secondary | ICD-10-CM | POA: Diagnosis not present

## 2019-10-25 DIAGNOSIS — J301 Allergic rhinitis due to pollen: Secondary | ICD-10-CM | POA: Diagnosis not present

## 2019-10-26 DIAGNOSIS — J3089 Other allergic rhinitis: Secondary | ICD-10-CM | POA: Diagnosis not present

## 2019-10-28 DIAGNOSIS — R5383 Other fatigue: Secondary | ICD-10-CM

## 2019-10-28 NOTE — Procedures (Signed)
   Patient Name: Corey Simmons, Corey Simmons Date: 10/15/2019 Gender: Male D.O.B: 26-Dec-1970 Age (years): 49 Referring Provider: Melida Quitter Height (inches): 60 Interpreting Physician: Baird Lyons MD, ABSM Weight (lbs): 185 RPSGT: Jacolyn Reedy BMI: 24 MRN: [id number] Neck Size: 16.00  CLINICAL INFORMATION Sleep Study Type: HST Indication for sleep study: Snoring Epworth Sleepiness Score:  0  SLEEP STUDY TECHNIQUE A multi-channel overnight portable sleep study was performed. The channels recorded were: nasal airflow, thoracic respiratory movement, and oxygen saturation with a pulse oximetry. Snoring was also monitored.  MEDICATIONS Patient self administered medications include: none reported.  SLEEP ARCHITECTURE Patient was studied for 393.5 minutes. The sleep efficiency was 100.0 % and the patient was supine for 9.4%. The arousal index was 0.0 per hour.  RESPIRATORY PARAMETERS The overall AHI was 5.2 per hour, with a central apnea index of 0.0 per hour. The oxygen nadir was 93% during sleep.  CARDIAC DATA Mean heart rate during sleep was 56.8 bpm.  IMPRESSIONS - Mild obstructive sleep apnea occurred during this study (AHI = 5.2/h). - No significant central sleep apnea occurred during this study (CAI = 0.0/h). - The patient had minimal or no oxygen desaturation during the study (Min O2 = 93%) - Patient snored.  DIAGNOSIS - Obstructive Sleep Apnea (327.23 [G47.33 ICD-10])  RECOMMENDATIONS - Treatment for very mild OSA is directed at symptoms. Discuss options with ordering provider.  - Be careful with alcohol, sedatives and other CNS depressants that may worsen sleep apnea and disrupt normal sleep architecture. - Sleep hygiene should be reviewed to assess factors that may improve sleep quality. - Weight management and regular exercise should be initiated or continued.  [Electronically signed] 10/28/2019 10:50 AM  Baird Lyons MD, ABSM Diplomate, American Board of  Sleep Medicine   NPI: [id number]                          [place]  ELECTRONICALLY SIGNED ON:  10/28/2019, 10:47 AM [place] PH: [phone]   FX: [phone] [place]

## 2019-10-31 DIAGNOSIS — E038 Other specified hypothyroidism: Secondary | ICD-10-CM | POA: Diagnosis not present

## 2019-10-31 DIAGNOSIS — J3081 Allergic rhinitis due to animal (cat) (dog) hair and dander: Secondary | ICD-10-CM | POA: Diagnosis not present

## 2019-10-31 DIAGNOSIS — J3089 Other allergic rhinitis: Secondary | ICD-10-CM | POA: Diagnosis not present

## 2019-10-31 DIAGNOSIS — J301 Allergic rhinitis due to pollen: Secondary | ICD-10-CM | POA: Diagnosis not present

## 2019-11-07 DIAGNOSIS — J3081 Allergic rhinitis due to animal (cat) (dog) hair and dander: Secondary | ICD-10-CM | POA: Diagnosis not present

## 2019-11-07 DIAGNOSIS — J3089 Other allergic rhinitis: Secondary | ICD-10-CM | POA: Diagnosis not present

## 2019-11-07 DIAGNOSIS — J301 Allergic rhinitis due to pollen: Secondary | ICD-10-CM | POA: Diagnosis not present

## 2019-11-10 DIAGNOSIS — J301 Allergic rhinitis due to pollen: Secondary | ICD-10-CM | POA: Diagnosis not present

## 2019-11-10 DIAGNOSIS — J3081 Allergic rhinitis due to animal (cat) (dog) hair and dander: Secondary | ICD-10-CM | POA: Diagnosis not present

## 2019-11-10 DIAGNOSIS — J3089 Other allergic rhinitis: Secondary | ICD-10-CM | POA: Diagnosis not present

## 2019-11-14 DIAGNOSIS — J3089 Other allergic rhinitis: Secondary | ICD-10-CM | POA: Diagnosis not present

## 2019-11-14 DIAGNOSIS — J301 Allergic rhinitis due to pollen: Secondary | ICD-10-CM | POA: Diagnosis not present

## 2019-11-14 DIAGNOSIS — J3081 Allergic rhinitis due to animal (cat) (dog) hair and dander: Secondary | ICD-10-CM | POA: Diagnosis not present

## 2019-11-15 ENCOUNTER — Other Ambulatory Visit: Payer: Self-pay

## 2019-11-15 ENCOUNTER — Encounter: Payer: Self-pay | Admitting: Podiatry

## 2019-11-15 ENCOUNTER — Ambulatory Visit: Payer: BC Managed Care – PPO | Admitting: Podiatry

## 2019-11-15 VITALS — Temp 98.0°F

## 2019-11-15 DIAGNOSIS — M7662 Achilles tendinitis, left leg: Secondary | ICD-10-CM

## 2019-11-15 DIAGNOSIS — M722 Plantar fascial fibromatosis: Secondary | ICD-10-CM

## 2019-11-16 NOTE — Progress Notes (Signed)
Subjective:   Patient ID: Corey Simmons, male   DOB: 49 y.o.   MRN: [id number]   HPI Patient presents due to his still getting problems with his left plantar fascia and states it has improved some but has not been able to do the type of activities he wants to do   ROS      Objective:  Physical Exam  Neurovascular status intact with patient found to have moderate discomfort plantar fascial left improved from previous but present     Assessment:  Plantar fasciitis left still present with moderate improvement     Plan:  Reviewed condition recommended long-term orthotics and casted if orthotics today and also dispensed night splint which I want him to start using on an ongoing basis in the evening and possibly to sleep but if he gets acute attacks.  Discussed plantar fasciitis and my goals long-term for him

## 2019-11-17 DIAGNOSIS — J301 Allergic rhinitis due to pollen: Secondary | ICD-10-CM | POA: Diagnosis not present

## 2019-11-17 DIAGNOSIS — J3081 Allergic rhinitis due to animal (cat) (dog) hair and dander: Secondary | ICD-10-CM | POA: Diagnosis not present

## 2019-11-17 DIAGNOSIS — J3089 Other allergic rhinitis: Secondary | ICD-10-CM | POA: Diagnosis not present

## 2019-11-23 DIAGNOSIS — J301 Allergic rhinitis due to pollen: Secondary | ICD-10-CM | POA: Diagnosis not present

## 2019-11-23 DIAGNOSIS — J3081 Allergic rhinitis due to animal (cat) (dog) hair and dander: Secondary | ICD-10-CM | POA: Diagnosis not present

## 2019-11-23 DIAGNOSIS — J3089 Other allergic rhinitis: Secondary | ICD-10-CM | POA: Diagnosis not present

## 2019-11-27 DIAGNOSIS — J301 Allergic rhinitis due to pollen: Secondary | ICD-10-CM | POA: Diagnosis not present

## 2019-11-27 DIAGNOSIS — J3089 Other allergic rhinitis: Secondary | ICD-10-CM | POA: Diagnosis not present

## 2019-11-27 DIAGNOSIS — J3081 Allergic rhinitis due to animal (cat) (dog) hair and dander: Secondary | ICD-10-CM | POA: Diagnosis not present

## 2019-12-04 DIAGNOSIS — J3081 Allergic rhinitis due to animal (cat) (dog) hair and dander: Secondary | ICD-10-CM | POA: Diagnosis not present

## 2019-12-04 DIAGNOSIS — J301 Allergic rhinitis due to pollen: Secondary | ICD-10-CM | POA: Diagnosis not present

## 2019-12-04 DIAGNOSIS — J3089 Other allergic rhinitis: Secondary | ICD-10-CM | POA: Diagnosis not present

## 2019-12-08 ENCOUNTER — Other Ambulatory Visit: Payer: BC Managed Care – PPO | Admitting: Orthotics

## 2019-12-08 ENCOUNTER — Other Ambulatory Visit: Payer: Self-pay

## 2019-12-11 DIAGNOSIS — J3081 Allergic rhinitis due to animal (cat) (dog) hair and dander: Secondary | ICD-10-CM | POA: Diagnosis not present

## 2019-12-11 DIAGNOSIS — J301 Allergic rhinitis due to pollen: Secondary | ICD-10-CM | POA: Diagnosis not present

## 2019-12-11 DIAGNOSIS — J3089 Other allergic rhinitis: Secondary | ICD-10-CM | POA: Diagnosis not present

## 2019-12-18 DIAGNOSIS — J3089 Other allergic rhinitis: Secondary | ICD-10-CM | POA: Diagnosis not present

## 2019-12-18 DIAGNOSIS — J3081 Allergic rhinitis due to animal (cat) (dog) hair and dander: Secondary | ICD-10-CM | POA: Diagnosis not present

## 2019-12-18 DIAGNOSIS — J301 Allergic rhinitis due to pollen: Secondary | ICD-10-CM | POA: Diagnosis not present

## 2019-12-25 DIAGNOSIS — J301 Allergic rhinitis due to pollen: Secondary | ICD-10-CM | POA: Diagnosis not present

## 2019-12-25 DIAGNOSIS — J3089 Other allergic rhinitis: Secondary | ICD-10-CM | POA: Diagnosis not present

## 2019-12-25 DIAGNOSIS — J3081 Allergic rhinitis due to animal (cat) (dog) hair and dander: Secondary | ICD-10-CM | POA: Diagnosis not present

## 2020-01-01 DIAGNOSIS — J3089 Other allergic rhinitis: Secondary | ICD-10-CM | POA: Diagnosis not present

## 2020-01-01 DIAGNOSIS — J301 Allergic rhinitis due to pollen: Secondary | ICD-10-CM | POA: Diagnosis not present

## 2020-01-01 DIAGNOSIS — J3081 Allergic rhinitis due to animal (cat) (dog) hair and dander: Secondary | ICD-10-CM | POA: Diagnosis not present

## 2020-01-04 ENCOUNTER — Other Ambulatory Visit: Payer: Self-pay | Admitting: Critical Care Medicine

## 2020-01-04 ENCOUNTER — Other Ambulatory Visit: Payer: Managed Care, Other (non HMO)

## 2020-01-04 DIAGNOSIS — Z20822 Contact with and (suspected) exposure to covid-19: Secondary | ICD-10-CM | POA: Diagnosis not present

## 2020-01-06 LAB — SARS-COV-2, NAA 2 DAY TAT

## 2020-01-06 LAB — NOVEL CORONAVIRUS, NAA: SARS-CoV-2, NAA: NOT DETECTED

## 2020-01-12 DIAGNOSIS — J3089 Other allergic rhinitis: Secondary | ICD-10-CM | POA: Diagnosis not present

## 2020-01-12 DIAGNOSIS — J301 Allergic rhinitis due to pollen: Secondary | ICD-10-CM | POA: Diagnosis not present

## 2020-01-12 DIAGNOSIS — J3081 Allergic rhinitis due to animal (cat) (dog) hair and dander: Secondary | ICD-10-CM | POA: Diagnosis not present

## 2020-01-16 DIAGNOSIS — J3081 Allergic rhinitis due to animal (cat) (dog) hair and dander: Secondary | ICD-10-CM | POA: Diagnosis not present

## 2020-01-16 DIAGNOSIS — J3089 Other allergic rhinitis: Secondary | ICD-10-CM | POA: Diagnosis not present

## 2020-01-16 DIAGNOSIS — J301 Allergic rhinitis due to pollen: Secondary | ICD-10-CM | POA: Diagnosis not present

## 2020-01-22 DIAGNOSIS — J3089 Other allergic rhinitis: Secondary | ICD-10-CM | POA: Diagnosis not present

## 2020-01-22 DIAGNOSIS — J301 Allergic rhinitis due to pollen: Secondary | ICD-10-CM | POA: Diagnosis not present

## 2020-01-22 DIAGNOSIS — J3081 Allergic rhinitis due to animal (cat) (dog) hair and dander: Secondary | ICD-10-CM | POA: Diagnosis not present

## 2020-01-26 DIAGNOSIS — J301 Allergic rhinitis due to pollen: Secondary | ICD-10-CM | POA: Diagnosis not present

## 2020-01-26 DIAGNOSIS — J3081 Allergic rhinitis due to animal (cat) (dog) hair and dander: Secondary | ICD-10-CM | POA: Diagnosis not present

## 2020-01-26 DIAGNOSIS — J3089 Other allergic rhinitis: Secondary | ICD-10-CM | POA: Diagnosis not present

## 2020-01-29 DIAGNOSIS — J301 Allergic rhinitis due to pollen: Secondary | ICD-10-CM | POA: Diagnosis not present

## 2020-01-29 DIAGNOSIS — J3081 Allergic rhinitis due to animal (cat) (dog) hair and dander: Secondary | ICD-10-CM | POA: Diagnosis not present

## 2020-01-29 DIAGNOSIS — J3089 Other allergic rhinitis: Secondary | ICD-10-CM | POA: Diagnosis not present

## 2020-02-02 DIAGNOSIS — J301 Allergic rhinitis due to pollen: Secondary | ICD-10-CM | POA: Diagnosis not present

## 2020-02-02 DIAGNOSIS — J3081 Allergic rhinitis due to animal (cat) (dog) hair and dander: Secondary | ICD-10-CM | POA: Diagnosis not present

## 2020-02-02 DIAGNOSIS — J3089 Other allergic rhinitis: Secondary | ICD-10-CM | POA: Diagnosis not present

## 2020-02-02 DIAGNOSIS — E039 Hypothyroidism, unspecified: Secondary | ICD-10-CM | POA: Diagnosis not present

## 2020-02-05 DIAGNOSIS — J301 Allergic rhinitis due to pollen: Secondary | ICD-10-CM | POA: Diagnosis not present

## 2020-02-05 DIAGNOSIS — J3081 Allergic rhinitis due to animal (cat) (dog) hair and dander: Secondary | ICD-10-CM | POA: Diagnosis not present

## 2020-02-05 DIAGNOSIS — J3089 Other allergic rhinitis: Secondary | ICD-10-CM | POA: Diagnosis not present

## 2020-02-09 DIAGNOSIS — J3089 Other allergic rhinitis: Secondary | ICD-10-CM | POA: Diagnosis not present

## 2020-02-09 DIAGNOSIS — J3081 Allergic rhinitis due to animal (cat) (dog) hair and dander: Secondary | ICD-10-CM | POA: Diagnosis not present

## 2020-02-09 DIAGNOSIS — J301 Allergic rhinitis due to pollen: Secondary | ICD-10-CM | POA: Diagnosis not present

## 2020-02-12 DIAGNOSIS — J3089 Other allergic rhinitis: Secondary | ICD-10-CM | POA: Diagnosis not present

## 2020-02-12 DIAGNOSIS — J3081 Allergic rhinitis due to animal (cat) (dog) hair and dander: Secondary | ICD-10-CM | POA: Diagnosis not present

## 2020-02-12 DIAGNOSIS — J301 Allergic rhinitis due to pollen: Secondary | ICD-10-CM | POA: Diagnosis not present

## 2020-02-16 DIAGNOSIS — J3081 Allergic rhinitis due to animal (cat) (dog) hair and dander: Secondary | ICD-10-CM | POA: Diagnosis not present

## 2020-02-16 DIAGNOSIS — J3089 Other allergic rhinitis: Secondary | ICD-10-CM | POA: Diagnosis not present

## 2020-02-16 DIAGNOSIS — J301 Allergic rhinitis due to pollen: Secondary | ICD-10-CM | POA: Diagnosis not present

## 2020-02-23 DIAGNOSIS — D225 Melanocytic nevi of trunk: Secondary | ICD-10-CM | POA: Diagnosis not present

## 2020-02-23 DIAGNOSIS — J301 Allergic rhinitis due to pollen: Secondary | ICD-10-CM | POA: Diagnosis not present

## 2020-02-23 DIAGNOSIS — Z85828 Personal history of other malignant neoplasm of skin: Secondary | ICD-10-CM | POA: Diagnosis not present

## 2020-02-23 DIAGNOSIS — L821 Other seborrheic keratosis: Secondary | ICD-10-CM | POA: Diagnosis not present

## 2020-02-23 DIAGNOSIS — J3089 Other allergic rhinitis: Secondary | ICD-10-CM | POA: Diagnosis not present

## 2020-02-23 DIAGNOSIS — J3081 Allergic rhinitis due to animal (cat) (dog) hair and dander: Secondary | ICD-10-CM | POA: Diagnosis not present

## 2020-02-23 DIAGNOSIS — L723 Sebaceous cyst: Secondary | ICD-10-CM | POA: Diagnosis not present

## 2020-02-29 DIAGNOSIS — J3089 Other allergic rhinitis: Secondary | ICD-10-CM | POA: Diagnosis not present

## 2020-02-29 DIAGNOSIS — J301 Allergic rhinitis due to pollen: Secondary | ICD-10-CM | POA: Diagnosis not present

## 2020-02-29 DIAGNOSIS — J3081 Allergic rhinitis due to animal (cat) (dog) hair and dander: Secondary | ICD-10-CM | POA: Diagnosis not present

## 2020-03-01 DIAGNOSIS — J3081 Allergic rhinitis due to animal (cat) (dog) hair and dander: Secondary | ICD-10-CM | POA: Diagnosis not present

## 2020-03-01 DIAGNOSIS — J301 Allergic rhinitis due to pollen: Secondary | ICD-10-CM | POA: Diagnosis not present

## 2020-03-04 DIAGNOSIS — J3089 Other allergic rhinitis: Secondary | ICD-10-CM | POA: Diagnosis not present

## 2020-03-05 NOTE — Progress Notes (Signed)
[facility] [address] [place] Phone: [phone] Subjective:   I Kandace Blitz am serving as a Education administrator for Dr. Hulan Saas.  This visit occurred during the SARS-CoV-2 public health emergency.  Safety protocols were in place, including screening questions prior to the visit, additional usage of staff PPE, and extensive cleaning of exam room while observing appropriate contact time as indicated for disinfecting solutions.   I'm seeing this patient by the request  of:  Marton Redwood, MD  CC: Left heel pain  PJA:[id number]  Corey Simmons is a 49 y.o. male coming in with complaint of left foot plantar fascitis. Patient has been seen by Dr. Paulla Dolly. Patient states the pain is chronic. Has tried a lot of modalities including multiple braces, stretching, orthotics and icing. Has had 2 injections in his foot. States the injections did help. Pain is not severe he believes he just reinjured it. Foot is sore by the end of the day after walks usually. 3/10 at its worse. Constant soreness. Icing at night helps. Patient has been treated for this for 2 years.   Patient did bring in x-rays.  These were independently visualized by me.  Patient has a very large calcaneal spur noted on the posterior aspect of the calcaneus but no significant findings of the plantar aspect of the calcaneus  No past medical history on file. No past surgical history on file. Social History   Socioeconomic History  . Marital status: Married    Spouse name: Not on file  . Number of children: Not on file  . Years of education: Not on file  . Highest education level: Not on file  Occupational History  . Not on file  Tobacco Use  . Smoking status: Never Smoker  . Smokeless tobacco: Never Used  Substance and Sexual Activity  . Alcohol use: No  . Drug use: No  . Sexual activity: Not on file  Other Topics Concern  . Not on file  Social History Narrative  . Not on file     Social Determinants of Health   Financial Resource Strain:   . Difficulty of Paying Living Expenses: Not on file  Food Insecurity:   . Worried About Charity fundraiser in the Last Year: Not on file  . Ran Out of Food in the Last Year: Not on file  Transportation Needs:   . Lack of Transportation (Medical): Not on file  . Lack of Transportation (Non-Medical): Not on file  Physical Activity:   . Days of Exercise per Week: Not on file  . Minutes of Exercise per Session: Not on file  Stress:   . Feeling of Stress : Not on file  Social Connections:   . Frequency of Communication with Friends and Family: Not on file  . Frequency of Social Gatherings with Friends and Family: Not on file  . Attends Religious Services: Not on file  . Active Member of Clubs or Organizations: Not on file  . Attends Archivist Meetings: Not on file  . Marital Status: Not on file   No Known Allergies No family history on file.  Current Outpatient Medications (Endocrine & Metabolic):  Marland Kitchen  Levothyroxine Sodium (SYNTHROID PO), Take 75 mg by mouth daily as needed.   Current Outpatient Medications (Cardiovascular):  .  nitroGLYCERIN (NITRO-DUR) 0.1 mg/hr patch, 1/4 patch daily  Current Outpatient Medications (Respiratory):  .  budesonide (PULMICORT) 0.5 MG/2ML nebulizer solution, budesonide 0.5 mg/2 mL suspension  for nebulization  PLEASE SEE ATTACHED FOR DETAILED DIRECTIONS .  fexofenadine (ALLEGRA) 180 MG tablet, Take 180 mg by mouth daily.    Current Outpatient Medications (Other):  .  gabapentin (NEURONTIN) 100 MG capsule, Take 2 capsules (200 mg total) by mouth at bedtime. .  Vitamin D, Ergocalciferol, (DRISDOL) 1.25 MG (50000 UNIT) CAPS capsule, Take 1 capsule (50,000 Units total) by mouth every 7 (seven) days.   Reviewed prior external information including notes and imaging from  primary care provider As well as notes that were available from care everywhere and other healthcare  systems.  Past medical history, social, surgical and family history all reviewed in electronic medical record.  No pertanent information unless stated regarding to the chief complaint.   Review of Systems:  No headache, visual changes, nausea, vomiting, diarrhea, constipation, dizziness, abdominal pain, skin rash, fevers, chills, night sweats, weight loss, swollen lymph nodes, body aches, joint swelling, chest pain, shortness of breath, mood changes. POSITIVE muscle aches  Objective  Blood pressure 110/80, pulse 68, weight 184 lb (83.5 kg), SpO2 98 %.   General: No apparent distress alert and oriented x3 mood and affect normal, dressed appropriately.  HEENT: Pupils equal, extraocular movements intact  Respiratory: Patient's speak in full sentences and does not appear short of breath  Cardiovascular: No lower extremity edema, non tender, no erythema  Neuro: Cranial nerves II through XII are intact, neurovascularly intact in all extremities with 2+ DTRs and 2+ pulses.  Gait normal with good balance and coordination.  MSK:  Non tender with full range of motion and good stability and symmetric strength and tone of shoulders, elbows, wrist, hip, knee and ankles bilaterally.  Left foot exam shows that patient does have very mild breakdown of the longitudinal arch and very mild breakdown the transverse arch.  With mild overpronation.  Patient does have a narrow heel.  Nontender over the Achilles, full range of motion noted.  Neurovascularly intact distally.  Negative squeeze test.  Limited musculoskeletal ultrasound was performed and interpreted Lyndal Pulley  Limited ultrasound of patient's heel does not show any significant enlargement of the plantar fascia noted today.  Patient does have a very mild cortical irregularity though at the origin of the plantar fascia on the calcaneal area.  No masses appreciated at this time.  Very minimal inflammation. impression: Mild cortical irregularity noted of  the calcaneal region on the plantar aspect of the foot   Impression and Recommendations:     The above documentation has been reviewed and is accurate and complete Lyndal Pulley, DO

## 2020-03-06 ENCOUNTER — Other Ambulatory Visit: Payer: Self-pay

## 2020-03-06 ENCOUNTER — Encounter: Payer: Self-pay | Admitting: Family Medicine

## 2020-03-06 ENCOUNTER — Ambulatory Visit: Payer: BC Managed Care – PPO | Admitting: Family Medicine

## 2020-03-06 ENCOUNTER — Ambulatory Visit: Payer: Self-pay

## 2020-03-06 VITALS — BP 110/80 | HR 68 | Wt 184.0 lb

## 2020-03-06 DIAGNOSIS — M79672 Pain in left foot: Secondary | ICD-10-CM

## 2020-03-06 MED ORDER — VITAMIN D (ERGOCALCIFEROL) 1.25 MG (50000 UNIT) PO CAPS: 50000.0000 [IU] | ORAL_CAPSULE | ORAL | 0 refills | Status: DC

## 2020-03-06 MED ORDER — GABAPENTIN 100 MG PO CAPS: 200.0000 mg | ORAL_CAPSULE | Freq: Every day | ORAL | 3 refills | Status: DC

## 2020-03-06 MED ORDER — NITROGLYCERIN 0.1 MG/HR TD PT24: MEDICATED_PATCH | TRANSDERMAL | 12 refills | Status: DC

## 2020-03-06 NOTE — Patient Instructions (Addendum)
Good to see you Gabapentin 200 mg at night Once weekly vitamin D K2 100-200 mcg daily for 4 weeks Nitro patch Stick to biking and elliptical for cardio See me again in 4-5 weeks  Nitroglycerin Protocol   Apply 1/4 nitroglycerin patch to affected area daily.  Change position of patch within the affected area every 24 hours.  You may experience a headache during the first 1-2 weeks of using the patch, these should subside.  If you experience headaches after beginning nitroglycerin patch treatment, you may take your preferred over the counter pain reliever.  Another side effect of the nitroglycerin patch is skin irritation or rash related to patch adhesive.  Please notify our office if you develop more severe headaches or rash, and stop the patch.  Tendon healing with nitroglycerin patch may require 12 to 24 weeks depending on the extent of injury.  Men should not use if taking Viagra, Cialis, or Levitra.   Do not use if you have migraines or rosacea.

## 2020-03-06 NOTE — Assessment & Plan Note (Addendum)
Patient is having significant left heel pain.  Patient has been treated for nearly 2 years for plantar fasciitis with very minimal improvement if any.  Patient is on ultrasound today does have what appears to be a very small cortical irregularity of the calcaneal area.  I do not see any true enlargement of the plantar fascia at this moment.  Patient also is having pain that seems to be worse with activity that usually goes against plantar fascia pathology.  At this point I would like to treat patient for the possibility of more of the stress reaction of the calcaneal area.  Patient will be given once weekly vitamin D, discussed over-the-counter medicines, patient also given the nitroglycerin protocol.  We discussed with him to avoid anything such as Cialis 5 Viagra which patient states he has never taken.  Patient warned of potential side effects.  Patient given a pneumatic heel compression sleeve and see if that would be beneficial to give him some relief as well.  Discussed different shoes.  Due to patient also not having significant findings on the ultrasound concern for potential nerve irritation that could be contributing as well and started on gabapentin.  Follow-up again in 4 to 6 weeks.  Due to the longevity of this if patient makes no significant difference in improvement I would like to consider advanced imaging with an MRI of the foot and ankle.  Patient is in agreement with the plan.

## 2020-03-08 DIAGNOSIS — J301 Allergic rhinitis due to pollen: Secondary | ICD-10-CM | POA: Diagnosis not present

## 2020-03-08 DIAGNOSIS — J3081 Allergic rhinitis due to animal (cat) (dog) hair and dander: Secondary | ICD-10-CM | POA: Diagnosis not present

## 2020-03-08 DIAGNOSIS — J3089 Other allergic rhinitis: Secondary | ICD-10-CM | POA: Diagnosis not present

## 2020-03-13 IMAGING — CT CT HEART SCORING
3 series · 13 of 20 positions shown, 15 images · non-contrast
Comparison: None.

CLINICAL DATA: Hyperlipidemia

EXAM:
CT HEART FOR CALCIUM SCORING
TECHNIQUE: CT heart was performed on a 64 channel system using prospective ECG
gating.
A non-contrast exam for calcium scoring was performed.
Note that this exam targets the heart and the chest was not imaged
in its entirety.

[Series 2: calcium scoring 2.00 qr36 bestdiast 70% · axial · 0.39mm/px · z∈[+1658,+1706]mm · 3 of 60 slices shown]
[im 12/60  vessel]
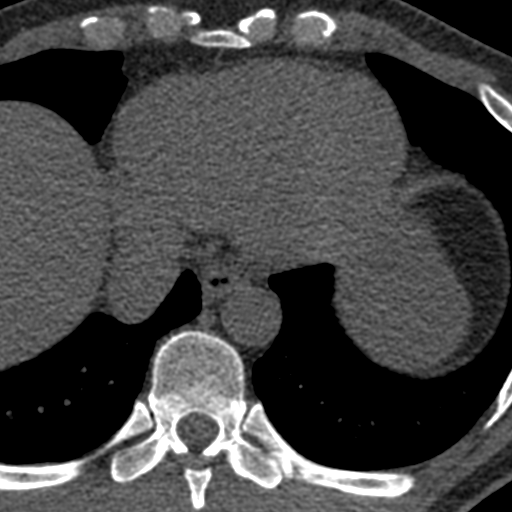
[im 24/60  vessel]
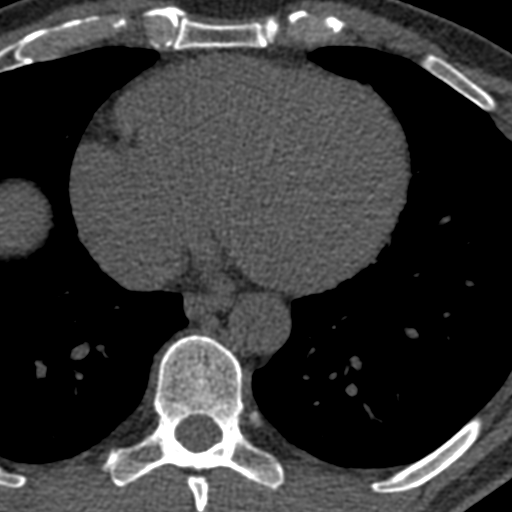
[im 36/60  vessel]
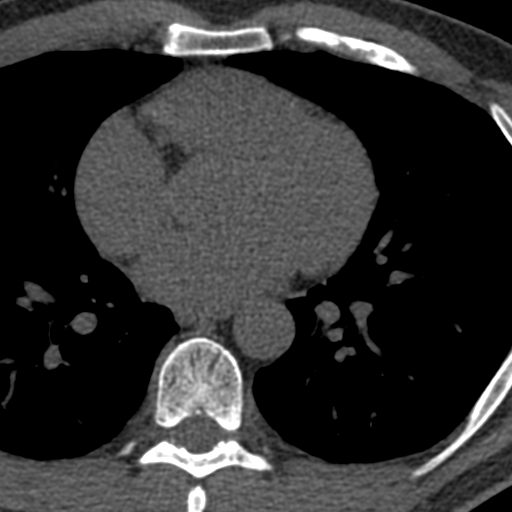

[Series 3: calcium scoring 2.00 br40 bestdiast 70% ax fov · axial · 0.49mm/px · z∈[+1654,+1734]mm · 5 of 60 slices shown, 7 images]
[im 10/60  vessel]
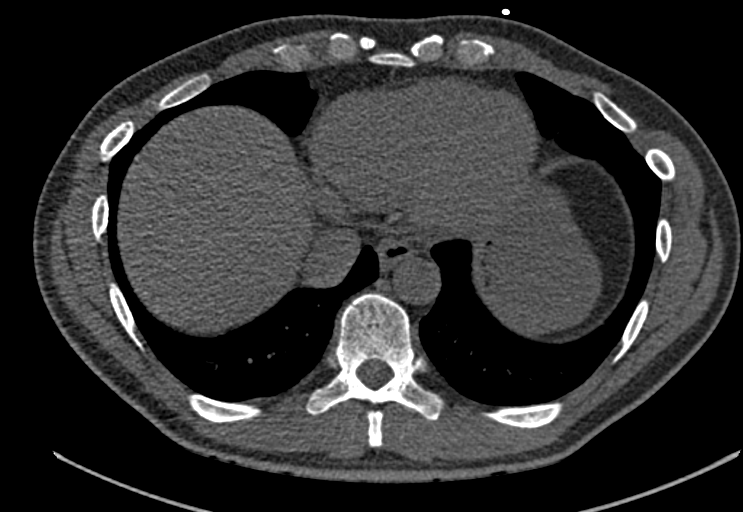
[im 10/60  lung]
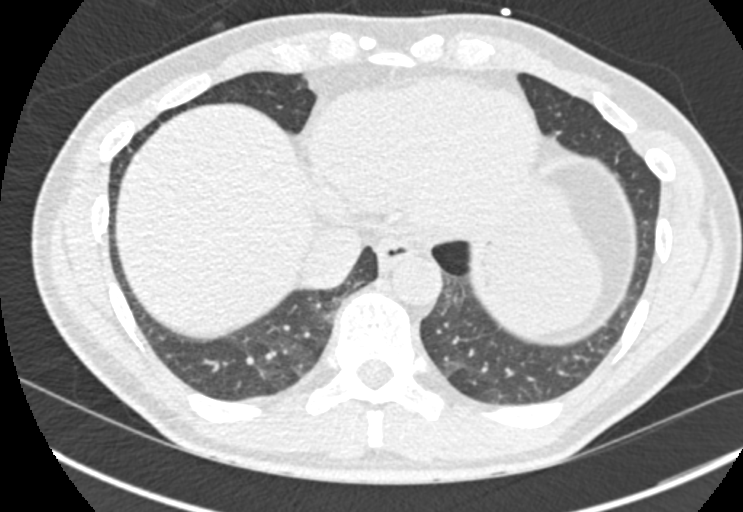
[im 20/60  vessel]
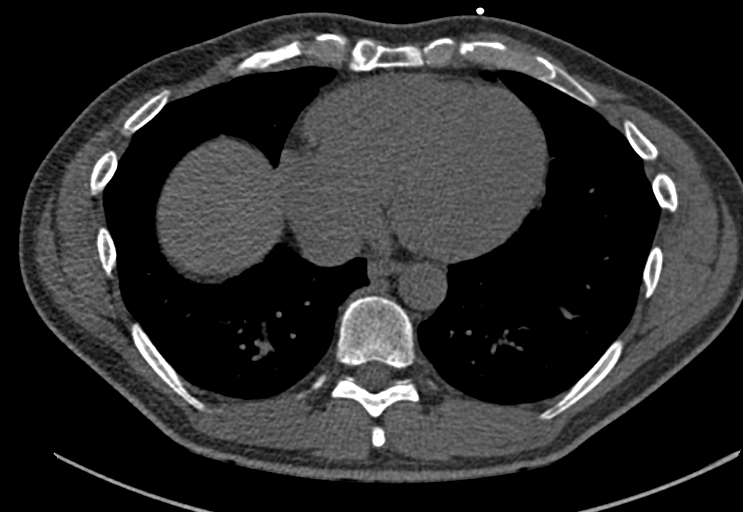
[im 30/60  vessel]
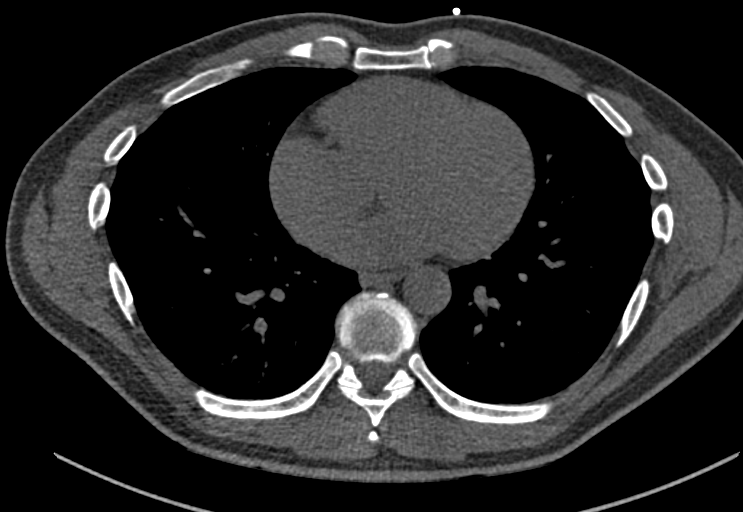
[im 40/60  vessel]
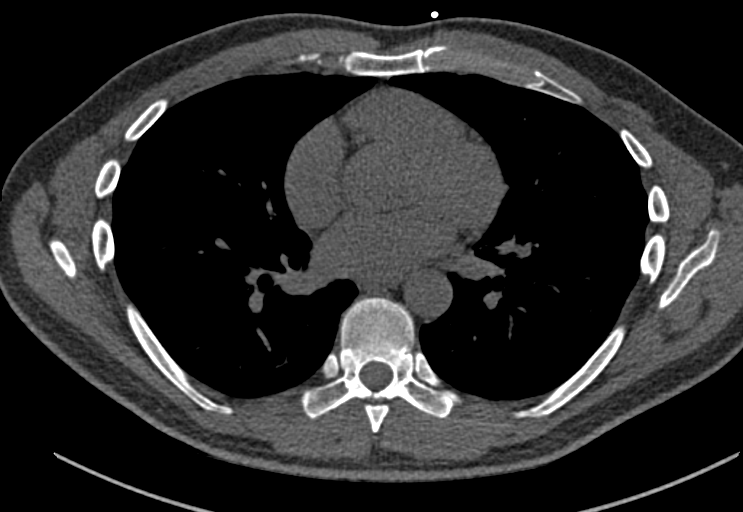
[im 50/60  vessel]
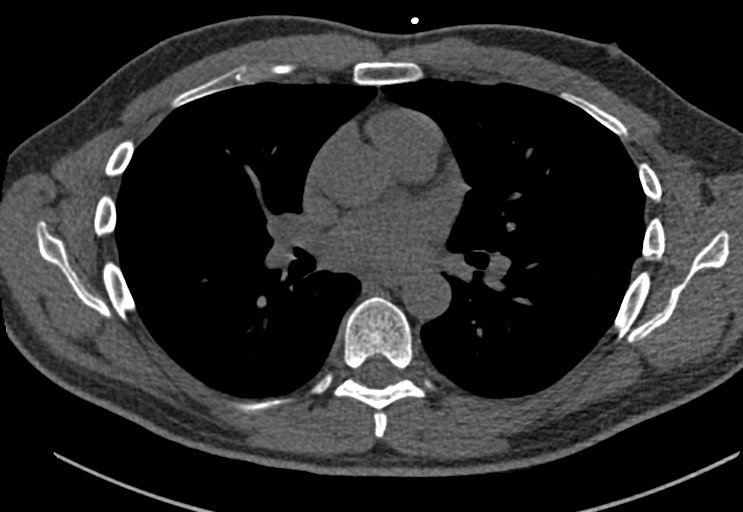
[im 50/60  lung]
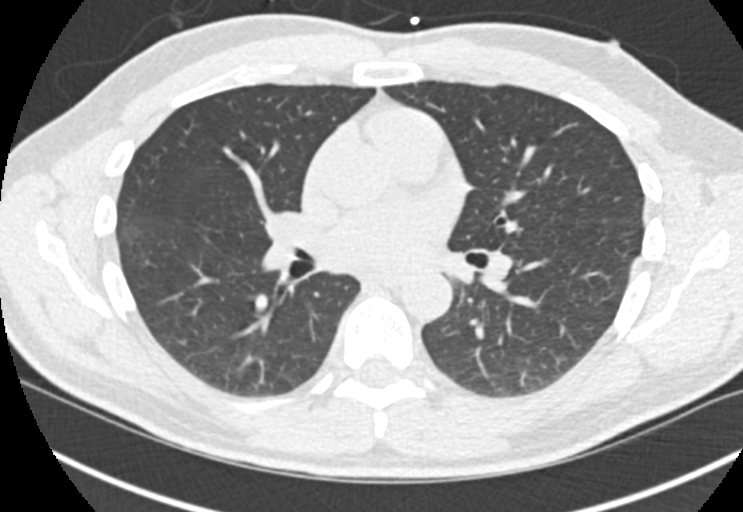

[Series 9: calcium scoring 2.00 br60 bestdiast 70% ax fov · axial · 0.48mm/px · z∈[+1654,+1734]mm · 5 of 60 slices shown]
[im 10/60  vessel]
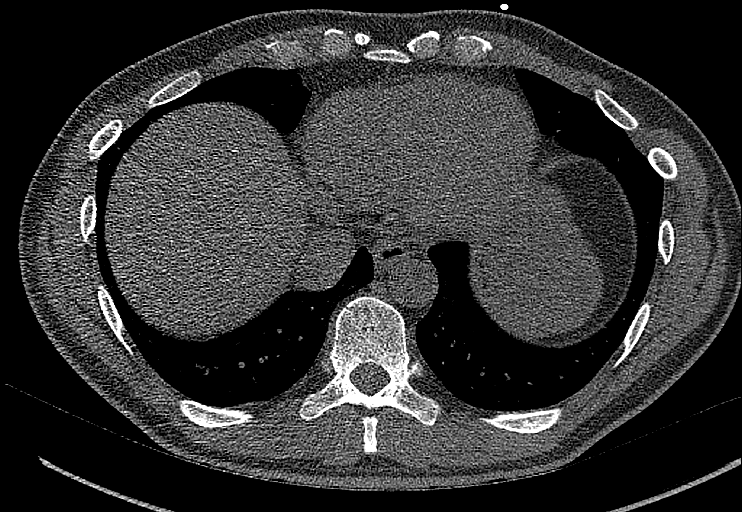
[im 20/60  vessel]
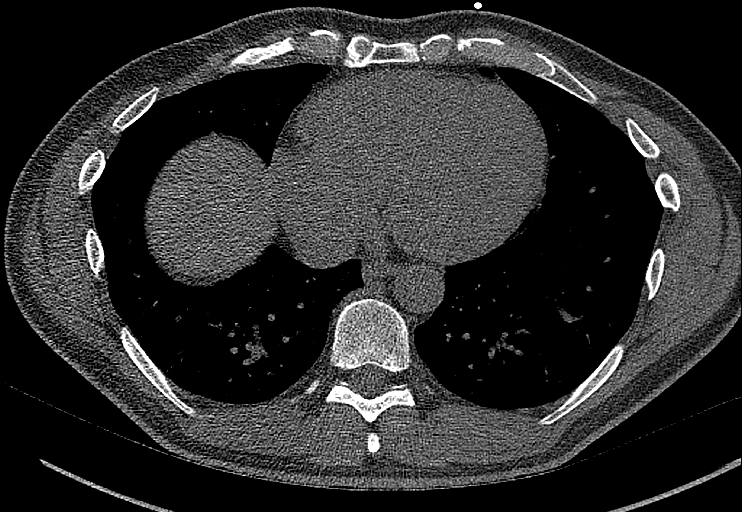
[im 30/60  vessel]
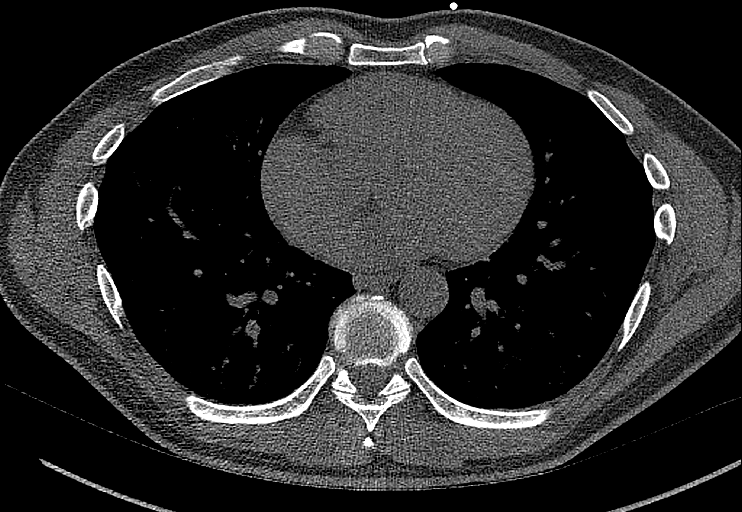
[im 40/60  vessel]
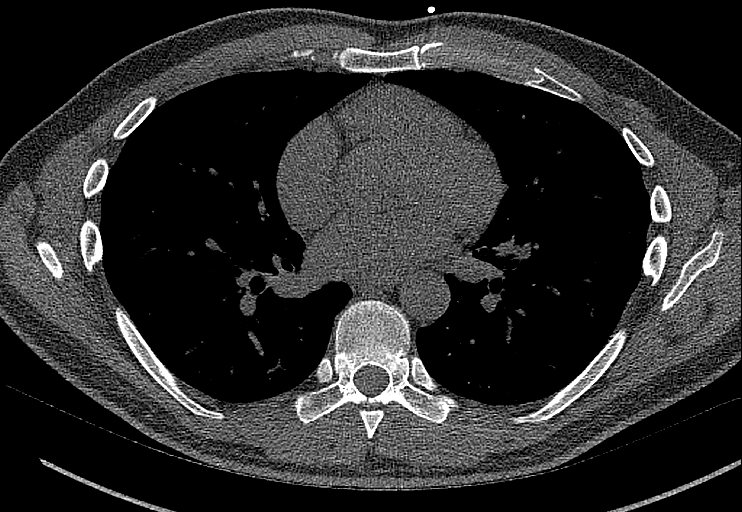
[im 50/60  vessel]
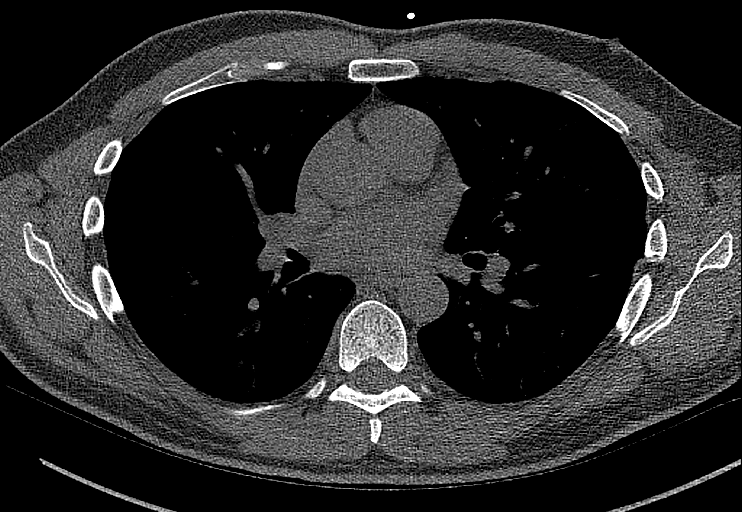

[13 of 20 positions shown; findings below may reference images not displayed]

FINDINGS: Technical quality: Good.

CORONARY CALCIUM

Total Agatston Score: 0

[HOSPITAL] percentile:  0

OTHER FINDINGS:

Cardiovascular: Heart is normal size.  Aorta is normal caliber.

Mediastinum/Nodes: No adenopathy in the lower mediastinum or hila.

Lungs/Pleura: Visualized lungs clear.  No effusions.

Upper Abdomen: Imaging into the upper abdomen shows no acute
findings.

Musculoskeletal: Chest wall soft tissues are unremarkable. No acute
bony abnormality.
IMPRESSION: No visible coronary artery calcifications. Total coronary calcium
score of 0.

No acute or significant extracardiac abnormality.

## 2020-03-18 DIAGNOSIS — J3089 Other allergic rhinitis: Secondary | ICD-10-CM | POA: Diagnosis not present

## 2020-03-18 DIAGNOSIS — J301 Allergic rhinitis due to pollen: Secondary | ICD-10-CM | POA: Diagnosis not present

## 2020-03-18 DIAGNOSIS — J3081 Allergic rhinitis due to animal (cat) (dog) hair and dander: Secondary | ICD-10-CM | POA: Diagnosis not present

## 2020-03-22 DIAGNOSIS — Z0000 Encounter for general adult medical examination without abnormal findings: Secondary | ICD-10-CM | POA: Diagnosis not present

## 2020-03-22 DIAGNOSIS — E785 Hyperlipidemia, unspecified: Secondary | ICD-10-CM | POA: Diagnosis not present

## 2020-03-22 DIAGNOSIS — Z125 Encounter for screening for malignant neoplasm of prostate: Secondary | ICD-10-CM | POA: Diagnosis not present

## 2020-03-22 DIAGNOSIS — E039 Hypothyroidism, unspecified: Secondary | ICD-10-CM | POA: Diagnosis not present

## 2020-03-26 DIAGNOSIS — J3081 Allergic rhinitis due to animal (cat) (dog) hair and dander: Secondary | ICD-10-CM | POA: Diagnosis not present

## 2020-03-26 DIAGNOSIS — J301 Allergic rhinitis due to pollen: Secondary | ICD-10-CM | POA: Diagnosis not present

## 2020-03-26 DIAGNOSIS — J3089 Other allergic rhinitis: Secondary | ICD-10-CM | POA: Diagnosis not present

## 2020-03-29 DIAGNOSIS — Z1331 Encounter for screening for depression: Secondary | ICD-10-CM | POA: Diagnosis not present

## 2020-03-29 DIAGNOSIS — Z0000 Encounter for general adult medical examination without abnormal findings: Secondary | ICD-10-CM | POA: Diagnosis not present

## 2020-03-29 DIAGNOSIS — R82998 Other abnormal findings in urine: Secondary | ICD-10-CM | POA: Diagnosis not present

## 2020-03-29 DIAGNOSIS — Z1389 Encounter for screening for other disorder: Secondary | ICD-10-CM | POA: Diagnosis not present

## 2020-03-29 DIAGNOSIS — E039 Hypothyroidism, unspecified: Secondary | ICD-10-CM | POA: Diagnosis not present

## 2020-04-08 DIAGNOSIS — J3081 Allergic rhinitis due to animal (cat) (dog) hair and dander: Secondary | ICD-10-CM | POA: Diagnosis not present

## 2020-04-08 DIAGNOSIS — J3089 Other allergic rhinitis: Secondary | ICD-10-CM | POA: Diagnosis not present

## 2020-04-08 DIAGNOSIS — J301 Allergic rhinitis due to pollen: Secondary | ICD-10-CM | POA: Diagnosis not present

## 2020-04-11 ENCOUNTER — Ambulatory Visit: Payer: BC Managed Care – PPO | Admitting: Family Medicine

## 2020-04-15 DIAGNOSIS — J3081 Allergic rhinitis due to animal (cat) (dog) hair and dander: Secondary | ICD-10-CM | POA: Diagnosis not present

## 2020-04-15 DIAGNOSIS — J3089 Other allergic rhinitis: Secondary | ICD-10-CM | POA: Diagnosis not present

## 2020-04-15 DIAGNOSIS — J301 Allergic rhinitis due to pollen: Secondary | ICD-10-CM | POA: Diagnosis not present

## 2020-04-18 ENCOUNTER — Ambulatory Visit: Payer: BC Managed Care – PPO | Admitting: Family Medicine

## 2020-04-18 ENCOUNTER — Encounter: Payer: Self-pay | Admitting: Family Medicine

## 2020-04-18 ENCOUNTER — Ambulatory Visit: Payer: Self-pay

## 2020-04-18 ENCOUNTER — Other Ambulatory Visit: Payer: Self-pay

## 2020-04-18 ENCOUNTER — Ambulatory Visit (INDEPENDENT_AMBULATORY_CARE_PROVIDER_SITE_OTHER): Payer: BC Managed Care – PPO

## 2020-04-18 VITALS — BP 110/70 | HR 66 | Ht 73.0 in | Wt 187.0 lb

## 2020-04-18 DIAGNOSIS — M79672 Pain in left foot: Secondary | ICD-10-CM

## 2020-04-18 DIAGNOSIS — M25572 Pain in left ankle and joints of left foot: Secondary | ICD-10-CM

## 2020-04-18 DIAGNOSIS — G8929 Other chronic pain: Secondary | ICD-10-CM | POA: Diagnosis not present

## 2020-04-18 NOTE — Progress Notes (Signed)
[place] Phone: [phone] Subjective:   Fontaine No, am serving as a scribe for Dr. Hulan Saas. This visit occurred during the SARS-CoV-2 public health emergency.  Safety protocols were in place, including screening questions prior to the visit, additional usage of staff PPE, and extensive cleaning of exam room while observing appropriate contact time as indicated for disinfecting solutions.   I'm seeing this patient by the request  of:  Marton Redwood, MD  CC: Left heel pain follow-up  [id number]   03/06/2020 Patient is having significant left heel pain.  Patient has been treated for nearly 2 years for plantar fasciitis with very minimal improvement if any.  Patient is on ultrasound today does have what appears to be a very small cortical irregularity of the calcaneal area.  I do not see any true enlargement of the plantar fascia at this moment.  Patient also is having pain that seems to be worse with activity that usually goes against plantar fascia pathology.  At this point I would like to treat patient for the possibility of more of the stress reaction of the calcaneal area.  Patient will be given once weekly vitamin D, discussed over-the-counter medicines, patient also given the nitroglycerin protocol.  We discussed with him to avoid anything such as Cialis 5 Viagra which patient states he has never taken.  Patient warned of potential side effects.  Patient given a pneumatic heel compression sleeve and see if that would be beneficial to give him some relief as well.  Discussed different shoes.  Due to patient also not having significant findings on the ultrasound concern for potential nerve irritation that could be contributing as well and started on gabapentin.  Follow-up again in 4 to 6 weeks.  Due to the longevity of this if patient makes no significant difference in improvement I would like to consider  advanced imaging with an MRI of the foot and ankle.  Patient is in agreement with the plan   Update 04/18/2020 Asbury Hair is a 49 y.o. male coming in with complaint of left foot pain. Patient states that he was not able to say on gabapentin. Does not feel like his making progress. Continued pain over medial aspect of calcanous. Still using nitro patches, vit d and K2.  Patient has not noticed any significant improvement.  Anytime he tries to increase activity has worsening pain      No past medical history on file. No past surgical history on file. Social History   Socioeconomic History  . Marital status: Married    Spouse name: Not on file  . Number of children: Not on file  . Years of education: Not on file  . Highest education level: Not on file  Occupational History  . Not on file  Tobacco Use  . Smoking status: Never Smoker  . Smokeless tobacco: Never Used  Substance and Sexual Activity  . Alcohol use: No  . Drug use: No  . Sexual activity: Not on file  Other Topics Concern  . Not on file  Social History Narrative  . Not on file   Social Determinants of Health   Financial Resource Strain: Not on file  Food Insecurity: Not on file  Transportation Needs: Not on file  Physical Activity: Not on file  Stress: Not on file  Social Connections: Not on file   No Known Allergies No family history on file.  Current Outpatient Medications (Endocrine &  Metabolic):  Marland Kitchen  Levothyroxine Sodium (SYNTHROID PO), Take 75 mg by mouth daily as needed.   Current Outpatient Medications (Cardiovascular):  .  nitroGLYCERIN (NITRO-DUR) 0.1 mg/hr patch, 1/4 patch daily  Current Outpatient Medications (Respiratory):  .  budesonide (PULMICORT) 0.5 MG/2ML nebulizer solution, budesonide 0.5 mg/2 mL suspension for nebulization  PLEASE SEE ATTACHED FOR DETAILED DIRECTIONS .  fexofenadine (ALLEGRA) 180 MG tablet, Take 180 mg by mouth daily.    Current Outpatient Medications (Other):   .  gabapentin (NEURONTIN) 100 MG capsule, Take 2 capsules (200 mg total) by mouth at bedtime. .  Vitamin D, Ergocalciferol, (DRISDOL) 1.25 MG (50000 UNIT) CAPS capsule, Take 1 capsule (50,000 Units total) by mouth every 7 (seven) days.   Reviewed prior external information including notes and imaging from  primary care provider As well as notes that were available from care everywhere and other healthcare systems.  Past medical history, social, surgical and family history all reviewed in electronic medical record.  No pertanent information unless stated regarding to the chief complaint.   Review of Systems:  No headache, visual changes, nausea, vomiting, diarrhea, constipation, dizziness, abdominal pain, skin rash, fevers, chills, night sweats, weight loss, swollen lymph nodes, body aches, joint swelling, chest pain, shortness of breath, mood changes.  Objective  Blood pressure 110/70, pulse 66, height 6\' 1"  (1.854 m), weight 187 lb (84.8 kg), SpO2 98 %.   General: No apparent distress alert and oriented x3 mood and affect normal, dressed appropriately.  HEENT: Pupils equal, extraocular movements intact  Respiratory: Patient's speak in full sentences and does not appear short of breath  Cardiovascular: No lower extremity edema, non tender, no erythema  Left foot exam still shows the patient is minorly tender to palpation on the plantar aspect medially of the calcaneal area.  Patient mild tightness of the posterior cord.  Minimal breakdown of the longitudinal arch and mild breakdown the transverse arch.   Impression and Recommendations:    The above documentation has been reviewed and is accurate and complete Lyndal Pulley, DO

## 2020-04-18 NOTE — Assessment & Plan Note (Signed)
Patient has been very religious at everything at this time including exercises, nitroglycerin, gabapentin and the vitamin D.  Patient has not noticed any significant improvement with what we have done.  This has been going on for greater than 2 years at this time.  We will get an ankle x-ray and then we will get MRI of the ankle to rule out tarsal tunnel as well as probably an MRI of the calcaneal region to further evaluate.  Patient has had this trouble for too long and we are not making progress with conservative therapy.  Depending on findings we will discuss further management.

## 2020-04-18 NOTE — Patient Instructions (Signed)
MRI foot-[phone] Stop other modalities for now Have appt in 4-6 weeks in case but hope to have direction sooner than that

## 2020-04-22 DIAGNOSIS — J3081 Allergic rhinitis due to animal (cat) (dog) hair and dander: Secondary | ICD-10-CM | POA: Diagnosis not present

## 2020-04-22 DIAGNOSIS — J301 Allergic rhinitis due to pollen: Secondary | ICD-10-CM | POA: Diagnosis not present

## 2020-04-22 DIAGNOSIS — J3089 Other allergic rhinitis: Secondary | ICD-10-CM | POA: Diagnosis not present

## 2020-04-23 DIAGNOSIS — Z1212 Encounter for screening for malignant neoplasm of rectum: Secondary | ICD-10-CM | POA: Diagnosis not present

## 2020-05-06 DIAGNOSIS — J301 Allergic rhinitis due to pollen: Secondary | ICD-10-CM | POA: Diagnosis not present

## 2020-05-06 DIAGNOSIS — J3081 Allergic rhinitis due to animal (cat) (dog) hair and dander: Secondary | ICD-10-CM | POA: Diagnosis not present

## 2020-05-06 DIAGNOSIS — J3089 Other allergic rhinitis: Secondary | ICD-10-CM | POA: Diagnosis not present

## 2020-05-07 DIAGNOSIS — M25572 Pain in left ankle and joints of left foot: Secondary | ICD-10-CM | POA: Diagnosis not present

## 2020-05-07 DIAGNOSIS — M79672 Pain in left foot: Secondary | ICD-10-CM | POA: Diagnosis not present

## 2020-05-08 ENCOUNTER — Encounter: Payer: Self-pay | Admitting: Family Medicine

## 2020-05-13 DIAGNOSIS — Z111 Encounter for screening for respiratory tuberculosis: Secondary | ICD-10-CM | POA: Diagnosis not present

## 2020-05-13 DIAGNOSIS — J3089 Other allergic rhinitis: Secondary | ICD-10-CM | POA: Diagnosis not present

## 2020-05-13 DIAGNOSIS — J3081 Allergic rhinitis due to animal (cat) (dog) hair and dander: Secondary | ICD-10-CM | POA: Diagnosis not present

## 2020-05-13 DIAGNOSIS — J301 Allergic rhinitis due to pollen: Secondary | ICD-10-CM | POA: Diagnosis not present

## 2020-05-14 ENCOUNTER — Other Ambulatory Visit: Payer: BC Managed Care – PPO

## 2020-05-20 DIAGNOSIS — J3089 Other allergic rhinitis: Secondary | ICD-10-CM | POA: Diagnosis not present

## 2020-05-20 DIAGNOSIS — J3081 Allergic rhinitis due to animal (cat) (dog) hair and dander: Secondary | ICD-10-CM | POA: Diagnosis not present

## 2020-05-20 DIAGNOSIS — J301 Allergic rhinitis due to pollen: Secondary | ICD-10-CM | POA: Diagnosis not present

## 2020-05-22 ENCOUNTER — Other Ambulatory Visit: Payer: Self-pay | Admitting: Family Medicine

## 2020-05-23 ENCOUNTER — Other Ambulatory Visit: Payer: BC Managed Care – PPO

## 2020-05-23 ENCOUNTER — Ambulatory Visit: Payer: BC Managed Care – PPO | Admitting: Family Medicine

## 2020-05-23 DIAGNOSIS — Z20822 Contact with and (suspected) exposure to covid-19: Secondary | ICD-10-CM

## 2020-05-23 NOTE — Progress Notes (Deleted)
[place] [employer] Phone: [phone] Subjective:    I'm seeing this patient by the request  of:  Marton Redwood, MD  CC:   [id number]  Obi Scrima is a 50 y.o. male coming in with complaint of ***  Onset-  Location Duration-  Character- Aggravating factors- Reliving factors-  Therapies tried-  Severity-   MRI ankle mild achilles paratenonitis  MRI foot unremarkable   No past medical history on file. No past surgical history on file. Social History   Socioeconomic History  . Marital status: Married    Spouse name: Not on file  . Number of children: Not on file  . Years of education: Not on file  . Highest education level: Not on file  Occupational History  . Not on file  Tobacco Use  . Smoking status: Never Smoker  . Smokeless tobacco: Never Used  Substance and Sexual Activity  . Alcohol use: No  . Drug use: No  . Sexual activity: Not on file  Other Topics Concern  . Not on file  Social History Narrative  . Not on file   Social Determinants of Health   Financial Resource Strain: Not on file  Food Insecurity: Not on file  Transportation Needs: Not on file  Physical Activity: Not on file  Stress: Not on file  Social Connections: Not on file   No Known Allergies No family history on file.  Current Outpatient Medications (Endocrine & Metabolic):  Marland Kitchen  Levothyroxine Sodium (SYNTHROID PO), Take 75 mg by mouth daily as needed.   Current Outpatient Medications (Cardiovascular):  .  nitroGLYCERIN (NITRO-DUR) 0.1 mg/hr patch, 1/4 patch daily  Current Outpatient Medications (Respiratory):  .  budesonide (PULMICORT) 0.5 MG/2ML nebulizer solution, budesonide 0.5 mg/2 mL suspension for nebulization  PLEASE SEE ATTACHED FOR DETAILED DIRECTIONS .  fexofenadine (ALLEGRA) 180 MG tablet, Take 180 mg by mouth daily.    Current Outpatient Medications (Other):  .  gabapentin (NEURONTIN) 100 MG  capsule, Take 2 capsules (200 mg total) by mouth at bedtime. .  Vitamin D, Ergocalciferol, (DRISDOL) 1.25 MG (50000 UNIT) CAPS capsule, TAKE 1 CAPSULE (50,000 UNITS TOTAL) BY MOUTH EVERY 7 (SEVEN) DAYS   Reviewed prior external information including notes and imaging from  primary care provider As well as notes that were available from care everywhere and other healthcare systems.  Past medical history, social, surgical and family history all reviewed in electronic medical record.  No pertanent information unless stated regarding to the chief complaint.   Review of Systems:  No headache, visual changes, nausea, vomiting, diarrhea, constipation, dizziness, abdominal pain, skin rash, fevers, chills, night sweats, weight loss, swollen lymph nodes, body aches, joint swelling, chest pain, shortness of breath, mood changes. POSITIVE muscle aches  Objective  There were no vitals taken for this visit.   General: No apparent distress alert and oriented x3 mood and affect normal, dressed appropriately.  HEENT: Pupils equal, extraocular movements intact  Respiratory: Patient's speak in full sentences and does not appear short of breath  Cardiovascular: No lower extremity edema, non tender, no erythema  Neuro: Cranial nerves II through XII are intact, neurovascularly intact in all extremities with 2+ DTRs and 2+ pulses.  Gait normal with good balance and coordination.  MSK:  Non tender with full range of motion and good stability and symmetric strength and tone of shoulders, elbows, wrist, hip, knee and ankles bilaterally.     Impression and Recommendations:  The above documentation has been reviewed and is accurate and complete Lyndal Pulley, DO

## 2020-05-26 LAB — NOVEL CORONAVIRUS, NAA: SARS-CoV-2, NAA: NOT DETECTED

## 2020-05-28 NOTE — Progress Notes (Deleted)
[place] Phone: [phone] Subjective:    I'm seeing this patient by the request  of:  Marton Redwood, MD  CC:   [id number]   04/15/2020 Patient has been very religious at everything at this time including exercises, nitroglycerin, gabapentin and the vitamin D.  Patient has not noticed any significant improvement with what we have done.  This has been going on for greater than 2 years at this time.  We will get an ankle x-ray and then we will get MRI of the ankle to rule out tarsal tunnel as well as probably an MRI of the calcaneal region to further evaluate.  Patient has had this trouble for too long and we are not making progress with conservative therapy.  Depending on findings we will discuss further management.  Update 05/29/2020 Corey Simmons is a 50 y.o. male coming in with complaint of left foot pain. Patient states        No past medical history on file. No past surgical history on file. Social History   Socioeconomic History  . Marital status: Married    Spouse name: Not on file  . Number of children: Not on file  . Years of education: Not on file  . Highest education level: Not on file  Occupational History  . Not on file  Tobacco Use  . Smoking status: Never Smoker  . Smokeless tobacco: Never Used  Substance and Sexual Activity  . Alcohol use: No  . Drug use: No  . Sexual activity: Not on file  Other Topics Concern  . Not on file  Social History Narrative  . Not on file   Social Determinants of Health   Financial Resource Strain: Not on file  Food Insecurity: Not on file  Transportation Needs: Not on file  Physical Activity: Not on file  Stress: Not on file  Social Connections: Not on file   No Known Allergies No family history on file.  Current Outpatient Medications (Endocrine & Metabolic):  Marland Kitchen  Levothyroxine Sodium (SYNTHROID PO), Take 75 mg by mouth daily as needed.    Current Outpatient Medications (Cardiovascular):  .  nitroGLYCERIN (NITRO-DUR) 0.1 mg/hr patch, 1/4 patch daily  Current Outpatient Medications (Respiratory):  .  budesonide (PULMICORT) 0.5 MG/2ML nebulizer solution, budesonide 0.5 mg/2 mL suspension for nebulization  PLEASE SEE ATTACHED FOR DETAILED DIRECTIONS .  fexofenadine (ALLEGRA) 180 MG tablet, Take 180 mg by mouth daily.    Current Outpatient Medications (Other):  .  gabapentin (NEURONTIN) 100 MG capsule, Take 2 capsules (200 mg total) by mouth at bedtime. .  Vitamin D, Ergocalciferol, (DRISDOL) 1.25 MG (50000 UNIT) CAPS capsule, TAKE 1 CAPSULE (50,000 UNITS TOTAL) BY MOUTH EVERY 7 (SEVEN) DAYS   Reviewed prior external information including notes and imaging from  primary care provider As well as notes that were available from care everywhere and other healthcare systems.  Past medical history, social, surgical and family history all reviewed in electronic medical record.  No pertanent information unless stated regarding to the chief complaint.   Review of Systems:  No headache, visual changes, nausea, vomiting, diarrhea, constipation, dizziness, abdominal pain, skin rash, fevers, chills, night sweats, weight loss, swollen lymph nodes, body aches, joint swelling, chest pain, shortness of breath, mood changes. POSITIVE muscle aches  Objective  There were no vitals taken for this visit.   General: No apparent distress alert and oriented x3 mood and affect normal, dressed appropriately.  HEENT: Pupils equal, extraocular movements intact  Respiratory: Patient's speak in full sentences and does not appear short of breath  Cardiovascular: No lower extremity edema, non tender, no erythema  Gait normal with good balance and coordination.  MSK:  Non tender with full range of motion and good stability and symmetric strength and tone of shoulders, elbows, wrist, hip, knee and ankles bilaterally.     Impression and Recommendations:      The above documentation has been reviewed and is accurate and complete Jacqualin Combes

## 2020-05-29 ENCOUNTER — Ambulatory Visit: Payer: BC Managed Care – PPO | Admitting: Family Medicine

## 2020-05-31 DIAGNOSIS — J3081 Allergic rhinitis due to animal (cat) (dog) hair and dander: Secondary | ICD-10-CM | POA: Diagnosis not present

## 2020-05-31 DIAGNOSIS — J3089 Other allergic rhinitis: Secondary | ICD-10-CM | POA: Diagnosis not present

## 2020-05-31 DIAGNOSIS — J301 Allergic rhinitis due to pollen: Secondary | ICD-10-CM | POA: Diagnosis not present

## 2020-06-03 DIAGNOSIS — J3081 Allergic rhinitis due to animal (cat) (dog) hair and dander: Secondary | ICD-10-CM | POA: Diagnosis not present

## 2020-06-03 DIAGNOSIS — J301 Allergic rhinitis due to pollen: Secondary | ICD-10-CM | POA: Diagnosis not present

## 2020-06-03 DIAGNOSIS — J3089 Other allergic rhinitis: Secondary | ICD-10-CM | POA: Diagnosis not present

## 2020-06-12 DIAGNOSIS — J3089 Other allergic rhinitis: Secondary | ICD-10-CM | POA: Diagnosis not present

## 2020-06-12 DIAGNOSIS — J301 Allergic rhinitis due to pollen: Secondary | ICD-10-CM | POA: Diagnosis not present

## 2020-06-12 DIAGNOSIS — J3081 Allergic rhinitis due to animal (cat) (dog) hair and dander: Secondary | ICD-10-CM | POA: Diagnosis not present

## 2020-06-17 DIAGNOSIS — J301 Allergic rhinitis due to pollen: Secondary | ICD-10-CM | POA: Diagnosis not present

## 2020-06-17 DIAGNOSIS — J3089 Other allergic rhinitis: Secondary | ICD-10-CM | POA: Diagnosis not present

## 2020-06-17 DIAGNOSIS — J3081 Allergic rhinitis due to animal (cat) (dog) hair and dander: Secondary | ICD-10-CM | POA: Diagnosis not present

## 2020-06-26 DIAGNOSIS — J3081 Allergic rhinitis due to animal (cat) (dog) hair and dander: Secondary | ICD-10-CM | POA: Diagnosis not present

## 2020-06-26 DIAGNOSIS — J301 Allergic rhinitis due to pollen: Secondary | ICD-10-CM | POA: Diagnosis not present

## 2020-06-26 DIAGNOSIS — J3089 Other allergic rhinitis: Secondary | ICD-10-CM | POA: Diagnosis not present

## 2020-07-02 DIAGNOSIS — J3089 Other allergic rhinitis: Secondary | ICD-10-CM | POA: Diagnosis not present

## 2020-07-02 DIAGNOSIS — J3081 Allergic rhinitis due to animal (cat) (dog) hair and dander: Secondary | ICD-10-CM | POA: Diagnosis not present

## 2020-07-02 DIAGNOSIS — J301 Allergic rhinitis due to pollen: Secondary | ICD-10-CM | POA: Diagnosis not present

## 2020-07-10 DIAGNOSIS — J301 Allergic rhinitis due to pollen: Secondary | ICD-10-CM | POA: Diagnosis not present

## 2020-07-10 DIAGNOSIS — J3081 Allergic rhinitis due to animal (cat) (dog) hair and dander: Secondary | ICD-10-CM | POA: Diagnosis not present

## 2020-07-10 DIAGNOSIS — J3089 Other allergic rhinitis: Secondary | ICD-10-CM | POA: Diagnosis not present

## 2020-07-16 DIAGNOSIS — J3089 Other allergic rhinitis: Secondary | ICD-10-CM | POA: Diagnosis not present

## 2020-07-16 DIAGNOSIS — J301 Allergic rhinitis due to pollen: Secondary | ICD-10-CM | POA: Diagnosis not present

## 2020-07-16 DIAGNOSIS — J3081 Allergic rhinitis due to animal (cat) (dog) hair and dander: Secondary | ICD-10-CM | POA: Diagnosis not present

## 2020-07-23 DIAGNOSIS — J3089 Other allergic rhinitis: Secondary | ICD-10-CM | POA: Diagnosis not present

## 2020-07-23 DIAGNOSIS — J3081 Allergic rhinitis due to animal (cat) (dog) hair and dander: Secondary | ICD-10-CM | POA: Diagnosis not present

## 2020-07-23 DIAGNOSIS — J301 Allergic rhinitis due to pollen: Secondary | ICD-10-CM | POA: Diagnosis not present

## 2020-07-31 DIAGNOSIS — J3081 Allergic rhinitis due to animal (cat) (dog) hair and dander: Secondary | ICD-10-CM | POA: Diagnosis not present

## 2020-07-31 DIAGNOSIS — J3089 Other allergic rhinitis: Secondary | ICD-10-CM | POA: Diagnosis not present

## 2020-07-31 DIAGNOSIS — J301 Allergic rhinitis due to pollen: Secondary | ICD-10-CM | POA: Diagnosis not present

## 2020-08-08 DIAGNOSIS — Z9889 Other specified postprocedural states: Secondary | ICD-10-CM | POA: Diagnosis not present

## 2020-08-08 DIAGNOSIS — J0101 Acute recurrent maxillary sinusitis: Secondary | ICD-10-CM | POA: Diagnosis not present

## 2020-09-26 DIAGNOSIS — L821 Other seborrheic keratosis: Secondary | ICD-10-CM | POA: Diagnosis not present

## 2020-09-26 DIAGNOSIS — L570 Actinic keratosis: Secondary | ICD-10-CM | POA: Diagnosis not present

## 2020-09-26 DIAGNOSIS — L738 Other specified follicular disorders: Secondary | ICD-10-CM | POA: Diagnosis not present

## 2020-09-26 DIAGNOSIS — L578 Other skin changes due to chronic exposure to nonionizing radiation: Secondary | ICD-10-CM | POA: Diagnosis not present

## 2020-10-24 DIAGNOSIS — Z9889 Other specified postprocedural states: Secondary | ICD-10-CM | POA: Diagnosis not present

## 2020-10-24 DIAGNOSIS — J329 Chronic sinusitis, unspecified: Secondary | ICD-10-CM | POA: Diagnosis not present

## 2020-12-11 DIAGNOSIS — J3081 Allergic rhinitis due to animal (cat) (dog) hair and dander: Secondary | ICD-10-CM | POA: Diagnosis not present

## 2020-12-11 DIAGNOSIS — J301 Allergic rhinitis due to pollen: Secondary | ICD-10-CM | POA: Diagnosis not present

## 2020-12-11 DIAGNOSIS — J3089 Other allergic rhinitis: Secondary | ICD-10-CM | POA: Diagnosis not present

## 2020-12-17 DIAGNOSIS — J301 Allergic rhinitis due to pollen: Secondary | ICD-10-CM | POA: Diagnosis not present

## 2020-12-17 DIAGNOSIS — J3081 Allergic rhinitis due to animal (cat) (dog) hair and dander: Secondary | ICD-10-CM | POA: Diagnosis not present

## 2020-12-17 DIAGNOSIS — J3089 Other allergic rhinitis: Secondary | ICD-10-CM | POA: Diagnosis not present

## 2020-12-20 DIAGNOSIS — J3089 Other allergic rhinitis: Secondary | ICD-10-CM | POA: Diagnosis not present

## 2020-12-20 DIAGNOSIS — J301 Allergic rhinitis due to pollen: Secondary | ICD-10-CM | POA: Diagnosis not present

## 2020-12-20 DIAGNOSIS — J3081 Allergic rhinitis due to animal (cat) (dog) hair and dander: Secondary | ICD-10-CM | POA: Diagnosis not present

## 2020-12-23 DIAGNOSIS — J301 Allergic rhinitis due to pollen: Secondary | ICD-10-CM | POA: Diagnosis not present

## 2020-12-23 DIAGNOSIS — J3081 Allergic rhinitis due to animal (cat) (dog) hair and dander: Secondary | ICD-10-CM | POA: Diagnosis not present

## 2020-12-23 DIAGNOSIS — J3089 Other allergic rhinitis: Secondary | ICD-10-CM | POA: Diagnosis not present

## 2020-12-27 DIAGNOSIS — J3081 Allergic rhinitis due to animal (cat) (dog) hair and dander: Secondary | ICD-10-CM | POA: Diagnosis not present

## 2020-12-27 DIAGNOSIS — J3089 Other allergic rhinitis: Secondary | ICD-10-CM | POA: Diagnosis not present

## 2020-12-27 DIAGNOSIS — J301 Allergic rhinitis due to pollen: Secondary | ICD-10-CM | POA: Diagnosis not present

## 2020-12-30 DIAGNOSIS — J3081 Allergic rhinitis due to animal (cat) (dog) hair and dander: Secondary | ICD-10-CM | POA: Diagnosis not present

## 2020-12-30 DIAGNOSIS — J3089 Other allergic rhinitis: Secondary | ICD-10-CM | POA: Diagnosis not present

## 2020-12-30 DIAGNOSIS — J301 Allergic rhinitis due to pollen: Secondary | ICD-10-CM | POA: Diagnosis not present

## 2021-01-06 DIAGNOSIS — J301 Allergic rhinitis due to pollen: Secondary | ICD-10-CM | POA: Diagnosis not present

## 2021-01-06 DIAGNOSIS — J3089 Other allergic rhinitis: Secondary | ICD-10-CM | POA: Diagnosis not present

## 2021-01-06 DIAGNOSIS — J3081 Allergic rhinitis due to animal (cat) (dog) hair and dander: Secondary | ICD-10-CM | POA: Diagnosis not present

## 2021-01-14 DIAGNOSIS — J3081 Allergic rhinitis due to animal (cat) (dog) hair and dander: Secondary | ICD-10-CM | POA: Diagnosis not present

## 2021-01-14 DIAGNOSIS — J3089 Other allergic rhinitis: Secondary | ICD-10-CM | POA: Diagnosis not present

## 2021-01-14 DIAGNOSIS — J301 Allergic rhinitis due to pollen: Secondary | ICD-10-CM | POA: Diagnosis not present

## 2021-01-20 DIAGNOSIS — J301 Allergic rhinitis due to pollen: Secondary | ICD-10-CM | POA: Diagnosis not present

## 2021-01-20 DIAGNOSIS — J3081 Allergic rhinitis due to animal (cat) (dog) hair and dander: Secondary | ICD-10-CM | POA: Diagnosis not present

## 2021-01-20 DIAGNOSIS — J3089 Other allergic rhinitis: Secondary | ICD-10-CM | POA: Diagnosis not present

## 2021-01-27 DIAGNOSIS — J3081 Allergic rhinitis due to animal (cat) (dog) hair and dander: Secondary | ICD-10-CM | POA: Diagnosis not present

## 2021-01-27 DIAGNOSIS — J301 Allergic rhinitis due to pollen: Secondary | ICD-10-CM | POA: Diagnosis not present

## 2021-01-27 DIAGNOSIS — J3089 Other allergic rhinitis: Secondary | ICD-10-CM | POA: Diagnosis not present

## 2021-01-31 DIAGNOSIS — J3089 Other allergic rhinitis: Secondary | ICD-10-CM | POA: Diagnosis not present

## 2021-01-31 DIAGNOSIS — J3081 Allergic rhinitis due to animal (cat) (dog) hair and dander: Secondary | ICD-10-CM | POA: Diagnosis not present

## 2021-01-31 DIAGNOSIS — J301 Allergic rhinitis due to pollen: Secondary | ICD-10-CM | POA: Diagnosis not present

## 2021-01-31 DIAGNOSIS — H1045 Other chronic allergic conjunctivitis: Secondary | ICD-10-CM | POA: Diagnosis not present

## 2021-02-10 DIAGNOSIS — J019 Acute sinusitis, unspecified: Secondary | ICD-10-CM | POA: Diagnosis not present

## 2021-02-11 DIAGNOSIS — J3089 Other allergic rhinitis: Secondary | ICD-10-CM | POA: Diagnosis not present

## 2021-02-11 DIAGNOSIS — J301 Allergic rhinitis due to pollen: Secondary | ICD-10-CM | POA: Diagnosis not present

## 2021-02-11 DIAGNOSIS — J3081 Allergic rhinitis due to animal (cat) (dog) hair and dander: Secondary | ICD-10-CM | POA: Diagnosis not present

## 2021-02-24 DIAGNOSIS — L821 Other seborrheic keratosis: Secondary | ICD-10-CM | POA: Diagnosis not present

## 2021-02-24 DIAGNOSIS — Z85828 Personal history of other malignant neoplasm of skin: Secondary | ICD-10-CM | POA: Diagnosis not present

## 2021-02-24 DIAGNOSIS — D225 Melanocytic nevi of trunk: Secondary | ICD-10-CM | POA: Diagnosis not present

## 2021-02-24 DIAGNOSIS — L570 Actinic keratosis: Secondary | ICD-10-CM | POA: Diagnosis not present

## 2021-02-26 DIAGNOSIS — J301 Allergic rhinitis due to pollen: Secondary | ICD-10-CM | POA: Diagnosis not present

## 2021-02-26 DIAGNOSIS — J3089 Other allergic rhinitis: Secondary | ICD-10-CM | POA: Diagnosis not present

## 2021-02-26 DIAGNOSIS — J3081 Allergic rhinitis due to animal (cat) (dog) hair and dander: Secondary | ICD-10-CM | POA: Diagnosis not present

## 2021-03-03 DIAGNOSIS — J301 Allergic rhinitis due to pollen: Secondary | ICD-10-CM | POA: Diagnosis not present

## 2021-03-03 DIAGNOSIS — J3089 Other allergic rhinitis: Secondary | ICD-10-CM | POA: Diagnosis not present

## 2021-03-03 DIAGNOSIS — J3081 Allergic rhinitis due to animal (cat) (dog) hair and dander: Secondary | ICD-10-CM | POA: Diagnosis not present

## 2021-03-14 DIAGNOSIS — J3089 Other allergic rhinitis: Secondary | ICD-10-CM | POA: Diagnosis not present

## 2021-03-14 DIAGNOSIS — J3081 Allergic rhinitis due to animal (cat) (dog) hair and dander: Secondary | ICD-10-CM | POA: Diagnosis not present

## 2021-03-14 DIAGNOSIS — J301 Allergic rhinitis due to pollen: Secondary | ICD-10-CM | POA: Diagnosis not present

## 2021-03-24 DIAGNOSIS — J3081 Allergic rhinitis due to animal (cat) (dog) hair and dander: Secondary | ICD-10-CM | POA: Diagnosis not present

## 2021-03-24 DIAGNOSIS — J3089 Other allergic rhinitis: Secondary | ICD-10-CM | POA: Diagnosis not present

## 2021-03-24 DIAGNOSIS — J301 Allergic rhinitis due to pollen: Secondary | ICD-10-CM | POA: Diagnosis not present

## 2021-03-27 DIAGNOSIS — J301 Allergic rhinitis due to pollen: Secondary | ICD-10-CM | POA: Diagnosis not present

## 2021-03-27 DIAGNOSIS — J3089 Other allergic rhinitis: Secondary | ICD-10-CM | POA: Diagnosis not present

## 2021-03-27 DIAGNOSIS — J3081 Allergic rhinitis due to animal (cat) (dog) hair and dander: Secondary | ICD-10-CM | POA: Diagnosis not present

## 2021-03-28 DIAGNOSIS — J3089 Other allergic rhinitis: Secondary | ICD-10-CM | POA: Diagnosis not present

## 2021-03-28 DIAGNOSIS — J301 Allergic rhinitis due to pollen: Secondary | ICD-10-CM | POA: Diagnosis not present

## 2021-03-28 DIAGNOSIS — J3081 Allergic rhinitis due to animal (cat) (dog) hair and dander: Secondary | ICD-10-CM | POA: Diagnosis not present

## 2021-04-08 DIAGNOSIS — J3089 Other allergic rhinitis: Secondary | ICD-10-CM | POA: Diagnosis not present

## 2021-04-08 DIAGNOSIS — J301 Allergic rhinitis due to pollen: Secondary | ICD-10-CM | POA: Diagnosis not present

## 2021-04-08 DIAGNOSIS — J3081 Allergic rhinitis due to animal (cat) (dog) hair and dander: Secondary | ICD-10-CM | POA: Diagnosis not present

## 2021-04-11 DIAGNOSIS — J301 Allergic rhinitis due to pollen: Secondary | ICD-10-CM | POA: Diagnosis not present

## 2021-04-11 DIAGNOSIS — J3081 Allergic rhinitis due to animal (cat) (dog) hair and dander: Secondary | ICD-10-CM | POA: Diagnosis not present

## 2021-04-11 DIAGNOSIS — J3089 Other allergic rhinitis: Secondary | ICD-10-CM | POA: Diagnosis not present

## 2021-04-14 DIAGNOSIS — J3081 Allergic rhinitis due to animal (cat) (dog) hair and dander: Secondary | ICD-10-CM | POA: Diagnosis not present

## 2021-04-14 DIAGNOSIS — J3089 Other allergic rhinitis: Secondary | ICD-10-CM | POA: Diagnosis not present

## 2021-04-14 DIAGNOSIS — J301 Allergic rhinitis due to pollen: Secondary | ICD-10-CM | POA: Diagnosis not present

## 2021-04-17 DIAGNOSIS — J301 Allergic rhinitis due to pollen: Secondary | ICD-10-CM | POA: Diagnosis not present

## 2021-04-17 DIAGNOSIS — J3081 Allergic rhinitis due to animal (cat) (dog) hair and dander: Secondary | ICD-10-CM | POA: Diagnosis not present

## 2021-04-17 DIAGNOSIS — J3089 Other allergic rhinitis: Secondary | ICD-10-CM | POA: Diagnosis not present

## 2021-04-21 DIAGNOSIS — J301 Allergic rhinitis due to pollen: Secondary | ICD-10-CM | POA: Diagnosis not present

## 2021-04-21 DIAGNOSIS — J3081 Allergic rhinitis due to animal (cat) (dog) hair and dander: Secondary | ICD-10-CM | POA: Diagnosis not present

## 2021-04-21 DIAGNOSIS — J3089 Other allergic rhinitis: Secondary | ICD-10-CM | POA: Diagnosis not present

## 2021-04-24 DIAGNOSIS — J3089 Other allergic rhinitis: Secondary | ICD-10-CM | POA: Diagnosis not present

## 2021-04-24 DIAGNOSIS — J3081 Allergic rhinitis due to animal (cat) (dog) hair and dander: Secondary | ICD-10-CM | POA: Diagnosis not present

## 2021-04-24 DIAGNOSIS — J301 Allergic rhinitis due to pollen: Secondary | ICD-10-CM | POA: Diagnosis not present

## 2021-04-29 DIAGNOSIS — J301 Allergic rhinitis due to pollen: Secondary | ICD-10-CM | POA: Diagnosis not present

## 2021-04-29 DIAGNOSIS — J3089 Other allergic rhinitis: Secondary | ICD-10-CM | POA: Diagnosis not present

## 2021-04-29 DIAGNOSIS — J3081 Allergic rhinitis due to animal (cat) (dog) hair and dander: Secondary | ICD-10-CM | POA: Diagnosis not present

## 2021-05-01 DIAGNOSIS — J301 Allergic rhinitis due to pollen: Secondary | ICD-10-CM | POA: Diagnosis not present

## 2021-05-01 DIAGNOSIS — J3089 Other allergic rhinitis: Secondary | ICD-10-CM | POA: Diagnosis not present

## 2021-05-01 DIAGNOSIS — J3081 Allergic rhinitis due to animal (cat) (dog) hair and dander: Secondary | ICD-10-CM | POA: Diagnosis not present

## 2021-05-02 DIAGNOSIS — E785 Hyperlipidemia, unspecified: Secondary | ICD-10-CM | POA: Diagnosis not present

## 2021-05-06 DIAGNOSIS — J3089 Other allergic rhinitis: Secondary | ICD-10-CM | POA: Diagnosis not present

## 2021-05-06 DIAGNOSIS — J3081 Allergic rhinitis due to animal (cat) (dog) hair and dander: Secondary | ICD-10-CM | POA: Diagnosis not present

## 2021-05-06 DIAGNOSIS — J301 Allergic rhinitis due to pollen: Secondary | ICD-10-CM | POA: Diagnosis not present

## 2021-05-08 DIAGNOSIS — J3081 Allergic rhinitis due to animal (cat) (dog) hair and dander: Secondary | ICD-10-CM | POA: Diagnosis not present

## 2021-05-08 DIAGNOSIS — J3089 Other allergic rhinitis: Secondary | ICD-10-CM | POA: Diagnosis not present

## 2021-05-08 DIAGNOSIS — J301 Allergic rhinitis due to pollen: Secondary | ICD-10-CM | POA: Diagnosis not present

## 2021-05-09 DIAGNOSIS — Z1331 Encounter for screening for depression: Secondary | ICD-10-CM | POA: Diagnosis not present

## 2021-05-09 DIAGNOSIS — E039 Hypothyroidism, unspecified: Secondary | ICD-10-CM | POA: Diagnosis not present

## 2021-05-09 DIAGNOSIS — R82998 Other abnormal findings in urine: Secondary | ICD-10-CM | POA: Diagnosis not present

## 2021-05-09 DIAGNOSIS — Z0000 Encounter for general adult medical examination without abnormal findings: Secondary | ICD-10-CM | POA: Diagnosis not present

## 2021-05-09 DIAGNOSIS — Z1339 Encounter for screening examination for other mental health and behavioral disorders: Secondary | ICD-10-CM | POA: Diagnosis not present

## 2021-05-14 DIAGNOSIS — J301 Allergic rhinitis due to pollen: Secondary | ICD-10-CM | POA: Diagnosis not present

## 2021-05-14 DIAGNOSIS — J3089 Other allergic rhinitis: Secondary | ICD-10-CM | POA: Diagnosis not present

## 2021-05-14 DIAGNOSIS — J3081 Allergic rhinitis due to animal (cat) (dog) hair and dander: Secondary | ICD-10-CM | POA: Diagnosis not present

## 2021-05-19 DIAGNOSIS — J301 Allergic rhinitis due to pollen: Secondary | ICD-10-CM | POA: Diagnosis not present

## 2021-05-19 DIAGNOSIS — J3089 Other allergic rhinitis: Secondary | ICD-10-CM | POA: Diagnosis not present

## 2021-05-19 DIAGNOSIS — J3081 Allergic rhinitis due to animal (cat) (dog) hair and dander: Secondary | ICD-10-CM | POA: Diagnosis not present

## 2021-05-30 ENCOUNTER — Ambulatory Visit (AMBULATORY_SURGERY_CENTER): Payer: BC Managed Care – PPO

## 2021-05-30 ENCOUNTER — Other Ambulatory Visit: Payer: Self-pay

## 2021-05-30 VITALS — Ht 73.0 in | Wt 180.0 lb

## 2021-05-30 DIAGNOSIS — J3089 Other allergic rhinitis: Secondary | ICD-10-CM | POA: Diagnosis not present

## 2021-05-30 DIAGNOSIS — J301 Allergic rhinitis due to pollen: Secondary | ICD-10-CM | POA: Diagnosis not present

## 2021-05-30 DIAGNOSIS — J3081 Allergic rhinitis due to animal (cat) (dog) hair and dander: Secondary | ICD-10-CM | POA: Diagnosis not present

## 2021-05-30 DIAGNOSIS — Z1211 Encounter for screening for malignant neoplasm of colon: Secondary | ICD-10-CM

## 2021-05-30 MED ORDER — PLENVU 140 G PO SOLR: 1.0000 | ORAL | 0 refills | Status: DC

## 2021-05-30 NOTE — Progress Notes (Signed)

## 2021-06-06 DIAGNOSIS — J3081 Allergic rhinitis due to animal (cat) (dog) hair and dander: Secondary | ICD-10-CM | POA: Diagnosis not present

## 2021-06-06 DIAGNOSIS — J301 Allergic rhinitis due to pollen: Secondary | ICD-10-CM | POA: Diagnosis not present

## 2021-06-06 DIAGNOSIS — J3089 Other allergic rhinitis: Secondary | ICD-10-CM | POA: Diagnosis not present

## 2021-06-12 ENCOUNTER — Encounter: Payer: Self-pay | Admitting: Internal Medicine

## 2021-06-13 ENCOUNTER — Ambulatory Visit (AMBULATORY_SURGERY_CENTER): Payer: BC Managed Care – PPO | Admitting: Internal Medicine

## 2021-06-13 ENCOUNTER — Encounter: Payer: Self-pay | Admitting: Internal Medicine

## 2021-06-13 VITALS — BP 113/81 | HR 60 | Temp 97.3°F | Resp 15 | Ht 73.0 in | Wt 180.0 lb

## 2021-06-13 DIAGNOSIS — Z1211 Encounter for screening for malignant neoplasm of colon: Secondary | ICD-10-CM

## 2021-06-13 DIAGNOSIS — K635 Polyp of colon: Secondary | ICD-10-CM

## 2021-06-13 DIAGNOSIS — D122 Benign neoplasm of ascending colon: Secondary | ICD-10-CM

## 2021-06-13 MED ORDER — SODIUM CHLORIDE 0.9 % IV SOLN: 500.0000 mL | Freq: Once | INTRAVENOUS | Status: DC

## 2021-06-13 NOTE — Progress Notes (Signed)
A and O x3. Report to RN. Tolerated MAC anesthesia well. 

## 2021-06-13 NOTE — Progress Notes (Signed)
HISTORY OF PRESENT ILLNESS:  Corey Simmons is a 51 y.o. male presents today for routine screening colonoscopy.  No active complaints  REVIEW OF SYSTEMS:  All non-GI ROS negative. Past Medical History:  Diagnosis Date   Allergy    Thyroid disease     Past Surgical History:  Procedure Laterality Date   NASAL SINUS SURGERY      Social History Corey Simmons  reports that he has never smoked. He has never used smokeless tobacco. He reports that he does not drink alcohol and does not use drugs.  family history includes Colon polyps in his father.  No Known Allergies     PHYSICAL EXAMINATION:  Vital signs: BP 123/88    Pulse 70    Temp (!) 97.3 F (36.3 C) (Temporal)    Ht 6\' 1"  (1.854 m)    Wt 180 lb (81.6 kg)    SpO2 99%    BMI 23.75 kg/m  General: Well-developed, well-nourished, no acute distress HEENT: Sclerae are anicteric, conjunctiva pink. Oral mucosa intact Lungs: Clear Heart: Regular Abdomen: soft, nontender, nondistended, no obvious ascites, no peritoneal signs, normal bowel sounds. No organomegaly. Extremities: No edema Psychiatric: alert and oriented x3. Cooperative     ASSESSMENT:   Colon cancer screening  PLAN:   Screening colonoscopy

## 2021-06-13 NOTE — Op Note (Signed)
[place] Patient Name: Corey Simmons Procedure Date: 06/13/2021 1:33 PM MRN: [id number] Endoscopist: Docia Chuck. Henrene Pastor , MD Age: 51 Referring MD:  Date of Birth: [birth year] Gender: Male Account #: [id number] Procedure:                Colonoscopy with cold snare polypectomy x 1 Indications:              Screening for colorectal malignant neoplasm Medicines:                Monitored Anesthesia Care Procedure:                Pre-Anesthesia Assessment:                           - Prior to the procedure, a History and Physical                            was performed, and patient medications and                            allergies were reviewed. The patient's tolerance of                            previous anesthesia was also reviewed. The risks                            and benefits of the procedure and the sedation                            options and risks were discussed with the patient.                            All questions were answered, and informed consent                            was obtained. Prior Anticoagulants: The patient has                            taken no previous anticoagulant or antiplatelet                            agents. ASA Grade Assessment: II - A patient with                            mild systemic disease. After reviewing the risks                            and benefits, the patient was deemed in                            satisfactory condition to undergo the procedure.                           After obtaining informed consent, the colonoscope  was passed under direct vision. Throughout the                            procedure, the patient's blood pressure, pulse, and                            oxygen saturations were monitored continuously. The                            Olympus CF-HQ190L ([id number]) Colonoscope was                            introduced through the anus and advanced to the the                             cecum, identified by appendiceal orifice and                            ileocecal valve. The ileocecal valve, appendiceal                            orifice, and rectum were photographed. The quality                            of the bowel preparation was excellent. The                            colonoscopy was performed without difficulty. The                            patient tolerated the procedure well. The bowel                            preparation used was SUPREP via split dose                            instruction. Scope In: 1:38:27 PM Scope Out: 1:57:57 PM Scope Withdrawal Time: 0 hours 16 minutes 1 second  Total Procedure Duration: 0 hours 19 minutes 30 seconds  Findings:                 A 5 mm polyp was found in the ascending colon. The                            polyp was sessile. The polyp was removed with a                            cold snare. Resection and retrieval were complete.                           A few diverticula were found in the sigmoid colon.                           Internal hemorrhoids were found during  retroflexion. The hemorrhoids were small.                           The exam was otherwise without abnormality on                            direct and retroflexion views. Complications:            No immediate complications. Estimated blood loss:                            None. Estimated Blood Loss:     Estimated blood loss: none. Impression:               - One 5 mm polyp in the ascending colon, removed                            with a cold snare. Resected and retrieved.                           - Diverticulosis in the sigmoid colon.                           - Internal hemorrhoids.                           - The examination was otherwise normal on direct                            and retroflexion views. Recommendation:           - Repeat colonoscopy in 5 years if polyp sessile                             serrated lesion, otherwise 10 years for                            surveillance.                           - Patient has a contact number available for                            emergencies. The signs and symptoms of potential                            delayed complications were discussed with the                            patient. Return to normal activities tomorrow.                            Written discharge instructions were provided to the                            patient.                           -  Resume previous diet.                           - Continue present medications.                           - Await pathology results. Docia Chuck. Henrene Pastor, MD 06/13/2021 2:03:32 PM This report has been signed electronically.

## 2021-06-13 NOTE — Progress Notes (Signed)
Called to room to assist during endoscopic procedure.  Patient ID and intended procedure confirmed with present staff. Received instructions for my participation in the procedure from the performing physician.  

## 2021-06-13 NOTE — Progress Notes (Signed)
Pt's states no medical or surgical changes since previsit or office visit. 

## 2021-06-13 NOTE — Patient Instructions (Signed)
Please read handouts provided. Continue present medications. Await pathology results.   YOU HAD AN ENDOSCOPIC PROCEDURE TODAY AT THE [facility]:   Refer to the procedure report that was given to you for any specific questions about what was found during the examination.  If the procedure report does not answer your questions, please call your gastroenterologist to clarify.  If you requested that your care partner not be given the details of your procedure findings, then the procedure report has been included in a sealed envelope for you to review at your convenience later.  YOU SHOULD EXPECT: Some feelings of bloating in the abdomen. Passage of more gas than usual.  Walking can help get rid of the air that was put into your GI tract during the procedure and reduce the bloating. If you had a lower endoscopy (such as a colonoscopy or flexible sigmoidoscopy) you may notice spotting of blood in your stool or on the toilet paper. If you underwent a bowel prep for your procedure, you may not have a normal bowel movement for a few days.  Please Note:  You might notice some irritation and congestion in your nose or some drainage.  This is from the oxygen used during your procedure.  There is no need for concern and it should clear up in a day or so.  SYMPTOMS TO REPORT IMMEDIATELY:  Following lower endoscopy (colonoscopy or flexible sigmoidoscopy):  Excessive amounts of blood in the stool  Significant tenderness or worsening of abdominal pains  Swelling of the abdomen that is new, acute  Fever of 100F or higher   For urgent or emergent issues, a gastroenterologist can be reached at any hour by calling [phone]. Do not use MyChart messaging for urgent concerns.    DIET:  We do recommend a small meal at first, but then you may proceed to your regular diet.  Drink plenty of fluids but you should avoid alcoholic beverages for 24 hours.  ACTIVITY:  You should plan to take it easy  for the rest of today and you should NOT DRIVE or use heavy machinery until tomorrow (because of the sedation medicines used during the test).    FOLLOW UP: Our staff will call the number listed on your records 48-72 hours following your procedure to check on you and address any questions or concerns that you may have regarding the information given to you following your procedure. If we do not reach you, we will leave a message.  We will attempt to reach you two times.  During this call, we will ask if you have developed any symptoms of COVID 19. If you develop any symptoms (ie: fever, flu-like symptoms, shortness of breath, cough etc.) before then, please call [phone].  If you test positive for Covid 19 in the 2 weeks post procedure, please call and report this information to us.    If any biopsies were taken you will be contacted by phone or by letter within the next 1-3 weeks.  Please call us at [phone] if you have not heard about the biopsies in 3 weeks.    SIGNATURES/CONFIDENTIALITY: You and/or your care partner have signed paperwork which will be entered into your electronic medical record.  These signatures attest to the fact that that the information above on your After Visit Summary has been reviewed and is understood.  Full responsibility of the confidentiality of this discharge information lies with you and/or your care-partner.  

## 2021-06-16 DIAGNOSIS — J3089 Other allergic rhinitis: Secondary | ICD-10-CM | POA: Diagnosis not present

## 2021-06-16 DIAGNOSIS — J3081 Allergic rhinitis due to animal (cat) (dog) hair and dander: Secondary | ICD-10-CM | POA: Diagnosis not present

## 2021-06-16 DIAGNOSIS — J301 Allergic rhinitis due to pollen: Secondary | ICD-10-CM | POA: Diagnosis not present

## 2021-06-17 ENCOUNTER — Telehealth: Payer: Self-pay

## 2021-06-17 NOTE — Telephone Encounter (Signed)
Left message on follow up call. 

## 2021-06-17 NOTE — Telephone Encounter (Signed)
°  Follow up Call-  Call back number 06/13/2021  Post procedure Call Back phone  # [phone]  Permission to leave phone message Yes  Some recent data might be hidden     Patient questions:  Do you have a fever, pain , or abdominal swelling? No. Pain Score  0 *  Have you tolerated food without any problems? Yes.    Have you been able to return to your normal activities? Yes.    Do you have any questions about your discharge instructions: Diet   No. Medications  No. Follow up visit  No.  Do you have questions or concerns about your Care? No.  Actions: * If pain score is 4 or above: No action needed, pain <4.

## 2021-06-18 ENCOUNTER — Encounter: Payer: Self-pay | Admitting: Internal Medicine

## 2021-06-24 DIAGNOSIS — J301 Allergic rhinitis due to pollen: Secondary | ICD-10-CM | POA: Diagnosis not present

## 2021-06-24 DIAGNOSIS — J3089 Other allergic rhinitis: Secondary | ICD-10-CM | POA: Diagnosis not present

## 2021-06-24 DIAGNOSIS — J3081 Allergic rhinitis due to animal (cat) (dog) hair and dander: Secondary | ICD-10-CM | POA: Diagnosis not present

## 2021-06-30 DIAGNOSIS — J3081 Allergic rhinitis due to animal (cat) (dog) hair and dander: Secondary | ICD-10-CM | POA: Diagnosis not present

## 2021-06-30 DIAGNOSIS — J301 Allergic rhinitis due to pollen: Secondary | ICD-10-CM | POA: Diagnosis not present

## 2021-06-30 DIAGNOSIS — J3089 Other allergic rhinitis: Secondary | ICD-10-CM | POA: Diagnosis not present

## 2021-07-01 DIAGNOSIS — J3089 Other allergic rhinitis: Secondary | ICD-10-CM | POA: Diagnosis not present

## 2021-07-10 DIAGNOSIS — J3081 Allergic rhinitis due to animal (cat) (dog) hair and dander: Secondary | ICD-10-CM | POA: Diagnosis not present

## 2021-07-10 DIAGNOSIS — J3089 Other allergic rhinitis: Secondary | ICD-10-CM | POA: Diagnosis not present

## 2021-07-10 DIAGNOSIS — J301 Allergic rhinitis due to pollen: Secondary | ICD-10-CM | POA: Diagnosis not present

## 2021-07-14 DIAGNOSIS — J3089 Other allergic rhinitis: Secondary | ICD-10-CM | POA: Diagnosis not present

## 2021-07-14 DIAGNOSIS — J3081 Allergic rhinitis due to animal (cat) (dog) hair and dander: Secondary | ICD-10-CM | POA: Diagnosis not present

## 2021-07-14 DIAGNOSIS — J301 Allergic rhinitis due to pollen: Secondary | ICD-10-CM | POA: Diagnosis not present

## 2021-07-25 DIAGNOSIS — J301 Allergic rhinitis due to pollen: Secondary | ICD-10-CM | POA: Diagnosis not present

## 2021-07-25 DIAGNOSIS — J3081 Allergic rhinitis due to animal (cat) (dog) hair and dander: Secondary | ICD-10-CM | POA: Diagnosis not present

## 2021-07-25 DIAGNOSIS — J3089 Other allergic rhinitis: Secondary | ICD-10-CM | POA: Diagnosis not present

## 2021-07-31 DIAGNOSIS — J301 Allergic rhinitis due to pollen: Secondary | ICD-10-CM | POA: Diagnosis not present

## 2021-07-31 DIAGNOSIS — J3081 Allergic rhinitis due to animal (cat) (dog) hair and dander: Secondary | ICD-10-CM | POA: Diagnosis not present

## 2021-07-31 DIAGNOSIS — J3089 Other allergic rhinitis: Secondary | ICD-10-CM | POA: Diagnosis not present

## 2021-08-07 DIAGNOSIS — J3081 Allergic rhinitis due to animal (cat) (dog) hair and dander: Secondary | ICD-10-CM | POA: Diagnosis not present

## 2021-08-07 DIAGNOSIS — J301 Allergic rhinitis due to pollen: Secondary | ICD-10-CM | POA: Diagnosis not present

## 2021-08-07 DIAGNOSIS — J3089 Other allergic rhinitis: Secondary | ICD-10-CM | POA: Diagnosis not present

## 2021-08-08 DIAGNOSIS — J3089 Other allergic rhinitis: Secondary | ICD-10-CM | POA: Diagnosis not present

## 2021-08-08 DIAGNOSIS — H1045 Other chronic allergic conjunctivitis: Secondary | ICD-10-CM | POA: Diagnosis not present

## 2021-08-08 DIAGNOSIS — J3081 Allergic rhinitis due to animal (cat) (dog) hair and dander: Secondary | ICD-10-CM | POA: Diagnosis not present

## 2021-08-08 DIAGNOSIS — J301 Allergic rhinitis due to pollen: Secondary | ICD-10-CM | POA: Diagnosis not present

## 2021-08-13 DIAGNOSIS — J3089 Other allergic rhinitis: Secondary | ICD-10-CM | POA: Diagnosis not present

## 2021-08-13 DIAGNOSIS — J301 Allergic rhinitis due to pollen: Secondary | ICD-10-CM | POA: Diagnosis not present

## 2021-08-13 DIAGNOSIS — J3081 Allergic rhinitis due to animal (cat) (dog) hair and dander: Secondary | ICD-10-CM | POA: Diagnosis not present

## 2021-08-26 DIAGNOSIS — J3081 Allergic rhinitis due to animal (cat) (dog) hair and dander: Secondary | ICD-10-CM | POA: Diagnosis not present

## 2021-08-26 DIAGNOSIS — J301 Allergic rhinitis due to pollen: Secondary | ICD-10-CM | POA: Diagnosis not present

## 2021-08-26 DIAGNOSIS — J3089 Other allergic rhinitis: Secondary | ICD-10-CM | POA: Diagnosis not present

## 2021-09-05 DIAGNOSIS — J3081 Allergic rhinitis due to animal (cat) (dog) hair and dander: Secondary | ICD-10-CM | POA: Diagnosis not present

## 2021-09-05 DIAGNOSIS — J3089 Other allergic rhinitis: Secondary | ICD-10-CM | POA: Diagnosis not present

## 2021-09-05 DIAGNOSIS — J301 Allergic rhinitis due to pollen: Secondary | ICD-10-CM | POA: Diagnosis not present

## 2021-09-12 DIAGNOSIS — J3081 Allergic rhinitis due to animal (cat) (dog) hair and dander: Secondary | ICD-10-CM | POA: Diagnosis not present

## 2021-09-12 DIAGNOSIS — J3089 Other allergic rhinitis: Secondary | ICD-10-CM | POA: Diagnosis not present

## 2021-09-12 DIAGNOSIS — J301 Allergic rhinitis due to pollen: Secondary | ICD-10-CM | POA: Diagnosis not present

## 2021-09-22 DIAGNOSIS — J3081 Allergic rhinitis due to animal (cat) (dog) hair and dander: Secondary | ICD-10-CM | POA: Diagnosis not present

## 2021-09-22 DIAGNOSIS — J3089 Other allergic rhinitis: Secondary | ICD-10-CM | POA: Diagnosis not present

## 2021-09-22 DIAGNOSIS — J301 Allergic rhinitis due to pollen: Secondary | ICD-10-CM | POA: Diagnosis not present

## 2021-09-30 DIAGNOSIS — J301 Allergic rhinitis due to pollen: Secondary | ICD-10-CM | POA: Diagnosis not present

## 2021-09-30 DIAGNOSIS — J3081 Allergic rhinitis due to animal (cat) (dog) hair and dander: Secondary | ICD-10-CM | POA: Diagnosis not present

## 2021-09-30 DIAGNOSIS — J3089 Other allergic rhinitis: Secondary | ICD-10-CM | POA: Diagnosis not present

## 2021-10-08 DIAGNOSIS — J3081 Allergic rhinitis due to animal (cat) (dog) hair and dander: Secondary | ICD-10-CM | POA: Diagnosis not present

## 2021-10-08 DIAGNOSIS — J301 Allergic rhinitis due to pollen: Secondary | ICD-10-CM | POA: Diagnosis not present

## 2021-10-08 DIAGNOSIS — J3089 Other allergic rhinitis: Secondary | ICD-10-CM | POA: Diagnosis not present

## 2021-10-09 DIAGNOSIS — R5383 Other fatigue: Secondary | ICD-10-CM | POA: Diagnosis not present

## 2021-10-09 DIAGNOSIS — E039 Hypothyroidism, unspecified: Secondary | ICD-10-CM | POA: Diagnosis not present

## 2021-10-14 DIAGNOSIS — J301 Allergic rhinitis due to pollen: Secondary | ICD-10-CM | POA: Diagnosis not present

## 2021-10-14 DIAGNOSIS — J3081 Allergic rhinitis due to animal (cat) (dog) hair and dander: Secondary | ICD-10-CM | POA: Diagnosis not present

## 2021-10-14 DIAGNOSIS — J3089 Other allergic rhinitis: Secondary | ICD-10-CM | POA: Diagnosis not present

## 2021-10-22 DIAGNOSIS — J301 Allergic rhinitis due to pollen: Secondary | ICD-10-CM | POA: Diagnosis not present

## 2021-10-22 DIAGNOSIS — J3089 Other allergic rhinitis: Secondary | ICD-10-CM | POA: Diagnosis not present

## 2021-10-22 DIAGNOSIS — J3081 Allergic rhinitis due to animal (cat) (dog) hair and dander: Secondary | ICD-10-CM | POA: Diagnosis not present

## 2021-11-03 DIAGNOSIS — J3081 Allergic rhinitis due to animal (cat) (dog) hair and dander: Secondary | ICD-10-CM | POA: Diagnosis not present

## 2021-11-03 DIAGNOSIS — J3089 Other allergic rhinitis: Secondary | ICD-10-CM | POA: Diagnosis not present

## 2021-11-03 DIAGNOSIS — J301 Allergic rhinitis due to pollen: Secondary | ICD-10-CM | POA: Diagnosis not present

## 2021-11-12 DIAGNOSIS — J301 Allergic rhinitis due to pollen: Secondary | ICD-10-CM | POA: Diagnosis not present

## 2021-11-12 DIAGNOSIS — J3081 Allergic rhinitis due to animal (cat) (dog) hair and dander: Secondary | ICD-10-CM | POA: Diagnosis not present

## 2021-11-12 DIAGNOSIS — J3089 Other allergic rhinitis: Secondary | ICD-10-CM | POA: Diagnosis not present

## 2021-11-21 DIAGNOSIS — J3081 Allergic rhinitis due to animal (cat) (dog) hair and dander: Secondary | ICD-10-CM | POA: Diagnosis not present

## 2021-11-21 DIAGNOSIS — J3089 Other allergic rhinitis: Secondary | ICD-10-CM | POA: Diagnosis not present

## 2021-11-21 DIAGNOSIS — J301 Allergic rhinitis due to pollen: Secondary | ICD-10-CM | POA: Diagnosis not present

## 2021-11-27 DIAGNOSIS — D0471 Carcinoma in situ of skin of right lower limb, including hip: Secondary | ICD-10-CM | POA: Diagnosis not present

## 2021-12-02 DIAGNOSIS — J3089 Other allergic rhinitis: Secondary | ICD-10-CM | POA: Diagnosis not present

## 2021-12-02 DIAGNOSIS — J301 Allergic rhinitis due to pollen: Secondary | ICD-10-CM | POA: Diagnosis not present

## 2021-12-02 DIAGNOSIS — J3081 Allergic rhinitis due to animal (cat) (dog) hair and dander: Secondary | ICD-10-CM | POA: Diagnosis not present

## 2021-12-10 DIAGNOSIS — J301 Allergic rhinitis due to pollen: Secondary | ICD-10-CM | POA: Diagnosis not present

## 2021-12-10 DIAGNOSIS — J3081 Allergic rhinitis due to animal (cat) (dog) hair and dander: Secondary | ICD-10-CM | POA: Diagnosis not present

## 2021-12-10 DIAGNOSIS — J3089 Other allergic rhinitis: Secondary | ICD-10-CM | POA: Diagnosis not present

## 2021-12-18 DIAGNOSIS — J3081 Allergic rhinitis due to animal (cat) (dog) hair and dander: Secondary | ICD-10-CM | POA: Diagnosis not present

## 2021-12-18 DIAGNOSIS — J3089 Other allergic rhinitis: Secondary | ICD-10-CM | POA: Diagnosis not present

## 2021-12-18 DIAGNOSIS — J301 Allergic rhinitis due to pollen: Secondary | ICD-10-CM | POA: Diagnosis not present

## 2021-12-22 DIAGNOSIS — Z9889 Other specified postprocedural states: Secondary | ICD-10-CM | POA: Diagnosis not present

## 2021-12-22 DIAGNOSIS — J329 Chronic sinusitis, unspecified: Secondary | ICD-10-CM | POA: Diagnosis not present

## 2021-12-23 DIAGNOSIS — J3081 Allergic rhinitis due to animal (cat) (dog) hair and dander: Secondary | ICD-10-CM | POA: Diagnosis not present

## 2021-12-23 DIAGNOSIS — J301 Allergic rhinitis due to pollen: Secondary | ICD-10-CM | POA: Diagnosis not present

## 2021-12-23 DIAGNOSIS — J3089 Other allergic rhinitis: Secondary | ICD-10-CM | POA: Diagnosis not present

## 2021-12-29 DIAGNOSIS — E785 Hyperlipidemia, unspecified: Secondary | ICD-10-CM | POA: Diagnosis not present

## 2021-12-29 DIAGNOSIS — E349 Endocrine disorder, unspecified: Secondary | ICD-10-CM | POA: Diagnosis not present

## 2021-12-31 DIAGNOSIS — J3081 Allergic rhinitis due to animal (cat) (dog) hair and dander: Secondary | ICD-10-CM | POA: Diagnosis not present

## 2021-12-31 DIAGNOSIS — J301 Allergic rhinitis due to pollen: Secondary | ICD-10-CM | POA: Diagnosis not present

## 2021-12-31 DIAGNOSIS — J3089 Other allergic rhinitis: Secondary | ICD-10-CM | POA: Diagnosis not present

## 2022-01-06 DIAGNOSIS — J3081 Allergic rhinitis due to animal (cat) (dog) hair and dander: Secondary | ICD-10-CM | POA: Diagnosis not present

## 2022-01-06 DIAGNOSIS — J3089 Other allergic rhinitis: Secondary | ICD-10-CM | POA: Diagnosis not present

## 2022-01-06 DIAGNOSIS — J301 Allergic rhinitis due to pollen: Secondary | ICD-10-CM | POA: Diagnosis not present

## 2022-01-15 DIAGNOSIS — J3081 Allergic rhinitis due to animal (cat) (dog) hair and dander: Secondary | ICD-10-CM | POA: Diagnosis not present

## 2022-01-15 DIAGNOSIS — J3089 Other allergic rhinitis: Secondary | ICD-10-CM | POA: Diagnosis not present

## 2022-01-15 DIAGNOSIS — J301 Allergic rhinitis due to pollen: Secondary | ICD-10-CM | POA: Diagnosis not present

## 2022-01-22 DIAGNOSIS — J301 Allergic rhinitis due to pollen: Secondary | ICD-10-CM | POA: Diagnosis not present

## 2022-01-22 DIAGNOSIS — J3081 Allergic rhinitis due to animal (cat) (dog) hair and dander: Secondary | ICD-10-CM | POA: Diagnosis not present

## 2022-01-22 DIAGNOSIS — J3089 Other allergic rhinitis: Secondary | ICD-10-CM | POA: Diagnosis not present

## 2022-01-28 DIAGNOSIS — J3089 Other allergic rhinitis: Secondary | ICD-10-CM | POA: Diagnosis not present

## 2022-01-28 DIAGNOSIS — J301 Allergic rhinitis due to pollen: Secondary | ICD-10-CM | POA: Diagnosis not present

## 2022-01-28 DIAGNOSIS — J3081 Allergic rhinitis due to animal (cat) (dog) hair and dander: Secondary | ICD-10-CM | POA: Diagnosis not present

## 2022-02-04 DIAGNOSIS — J3089 Other allergic rhinitis: Secondary | ICD-10-CM | POA: Diagnosis not present

## 2022-02-04 DIAGNOSIS — J301 Allergic rhinitis due to pollen: Secondary | ICD-10-CM | POA: Diagnosis not present

## 2022-02-04 DIAGNOSIS — J3081 Allergic rhinitis due to animal (cat) (dog) hair and dander: Secondary | ICD-10-CM | POA: Diagnosis not present

## 2022-02-11 DIAGNOSIS — J301 Allergic rhinitis due to pollen: Secondary | ICD-10-CM | POA: Diagnosis not present

## 2022-02-11 DIAGNOSIS — J3081 Allergic rhinitis due to animal (cat) (dog) hair and dander: Secondary | ICD-10-CM | POA: Diagnosis not present

## 2022-02-12 DIAGNOSIS — J3089 Other allergic rhinitis: Secondary | ICD-10-CM | POA: Diagnosis not present

## 2022-02-24 DIAGNOSIS — Z85828 Personal history of other malignant neoplasm of skin: Secondary | ICD-10-CM | POA: Diagnosis not present

## 2022-02-24 DIAGNOSIS — L723 Sebaceous cyst: Secondary | ICD-10-CM | POA: Diagnosis not present

## 2022-02-24 DIAGNOSIS — L821 Other seborrheic keratosis: Secondary | ICD-10-CM | POA: Diagnosis not present

## 2022-02-24 DIAGNOSIS — D225 Melanocytic nevi of trunk: Secondary | ICD-10-CM | POA: Diagnosis not present

## 2022-02-25 DIAGNOSIS — J3081 Allergic rhinitis due to animal (cat) (dog) hair and dander: Secondary | ICD-10-CM | POA: Diagnosis not present

## 2022-02-25 DIAGNOSIS — J301 Allergic rhinitis due to pollen: Secondary | ICD-10-CM | POA: Diagnosis not present

## 2022-02-25 DIAGNOSIS — J3089 Other allergic rhinitis: Secondary | ICD-10-CM | POA: Diagnosis not present

## 2022-03-05 DIAGNOSIS — J301 Allergic rhinitis due to pollen: Secondary | ICD-10-CM | POA: Diagnosis not present

## 2022-03-05 DIAGNOSIS — J3081 Allergic rhinitis due to animal (cat) (dog) hair and dander: Secondary | ICD-10-CM | POA: Diagnosis not present

## 2022-03-05 DIAGNOSIS — Z23 Encounter for immunization: Secondary | ICD-10-CM | POA: Diagnosis not present

## 2022-03-05 DIAGNOSIS — J3089 Other allergic rhinitis: Secondary | ICD-10-CM | POA: Diagnosis not present

## 2022-03-10 DIAGNOSIS — J3081 Allergic rhinitis due to animal (cat) (dog) hair and dander: Secondary | ICD-10-CM | POA: Diagnosis not present

## 2022-03-10 DIAGNOSIS — J301 Allergic rhinitis due to pollen: Secondary | ICD-10-CM | POA: Diagnosis not present

## 2022-03-10 DIAGNOSIS — J3089 Other allergic rhinitis: Secondary | ICD-10-CM | POA: Diagnosis not present

## 2022-03-16 DIAGNOSIS — J301 Allergic rhinitis due to pollen: Secondary | ICD-10-CM | POA: Diagnosis not present

## 2022-03-16 DIAGNOSIS — J3081 Allergic rhinitis due to animal (cat) (dog) hair and dander: Secondary | ICD-10-CM | POA: Diagnosis not present

## 2022-03-16 DIAGNOSIS — J3089 Other allergic rhinitis: Secondary | ICD-10-CM | POA: Diagnosis not present

## 2022-03-26 DIAGNOSIS — J3081 Allergic rhinitis due to animal (cat) (dog) hair and dander: Secondary | ICD-10-CM | POA: Diagnosis not present

## 2022-03-26 DIAGNOSIS — J301 Allergic rhinitis due to pollen: Secondary | ICD-10-CM | POA: Diagnosis not present

## 2022-03-26 DIAGNOSIS — J3089 Other allergic rhinitis: Secondary | ICD-10-CM | POA: Diagnosis not present

## 2022-04-01 DIAGNOSIS — J3089 Other allergic rhinitis: Secondary | ICD-10-CM | POA: Diagnosis not present

## 2022-04-01 DIAGNOSIS — J301 Allergic rhinitis due to pollen: Secondary | ICD-10-CM | POA: Diagnosis not present

## 2022-04-01 DIAGNOSIS — J3081 Allergic rhinitis due to animal (cat) (dog) hair and dander: Secondary | ICD-10-CM | POA: Diagnosis not present

## 2022-04-07 DIAGNOSIS — J3089 Other allergic rhinitis: Secondary | ICD-10-CM | POA: Diagnosis not present

## 2022-04-07 DIAGNOSIS — J3081 Allergic rhinitis due to animal (cat) (dog) hair and dander: Secondary | ICD-10-CM | POA: Diagnosis not present

## 2022-04-07 DIAGNOSIS — J301 Allergic rhinitis due to pollen: Secondary | ICD-10-CM | POA: Diagnosis not present

## 2022-04-11 IMAGING — DX DG ANKLE COMPLETE 3+V*L*
3 series · 3 of 3 positions shown · non-contrast
Comparison: No prior.

CLINICAL DATA: Left ankle pain.

EXAM:
LEFT ANKLE COMPLETE - 3+ VIEW

[ankle ap]
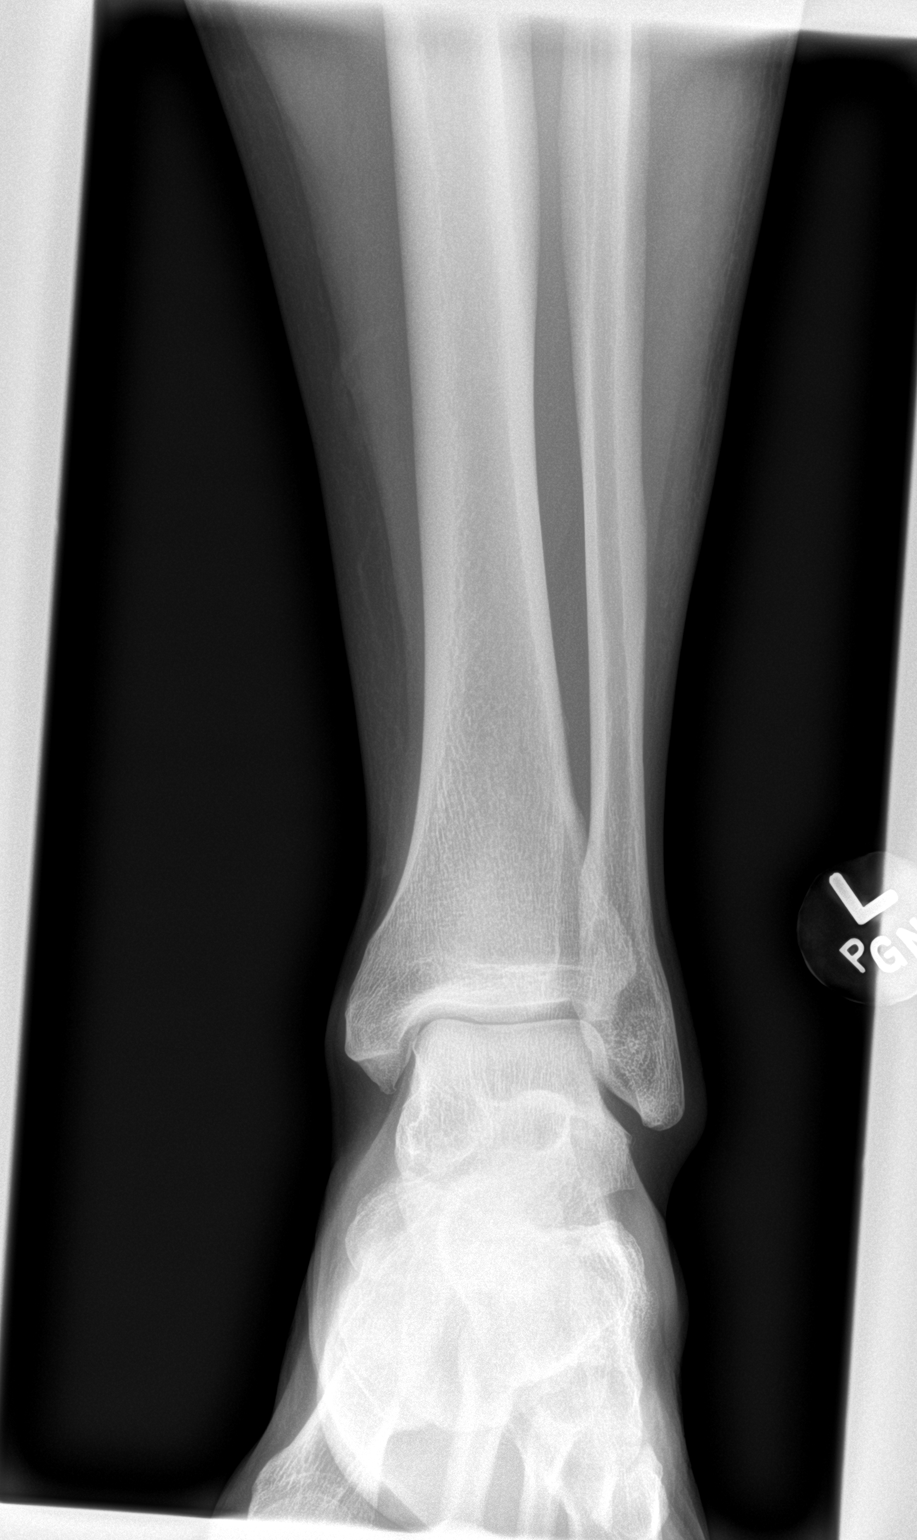

[ankle obl]
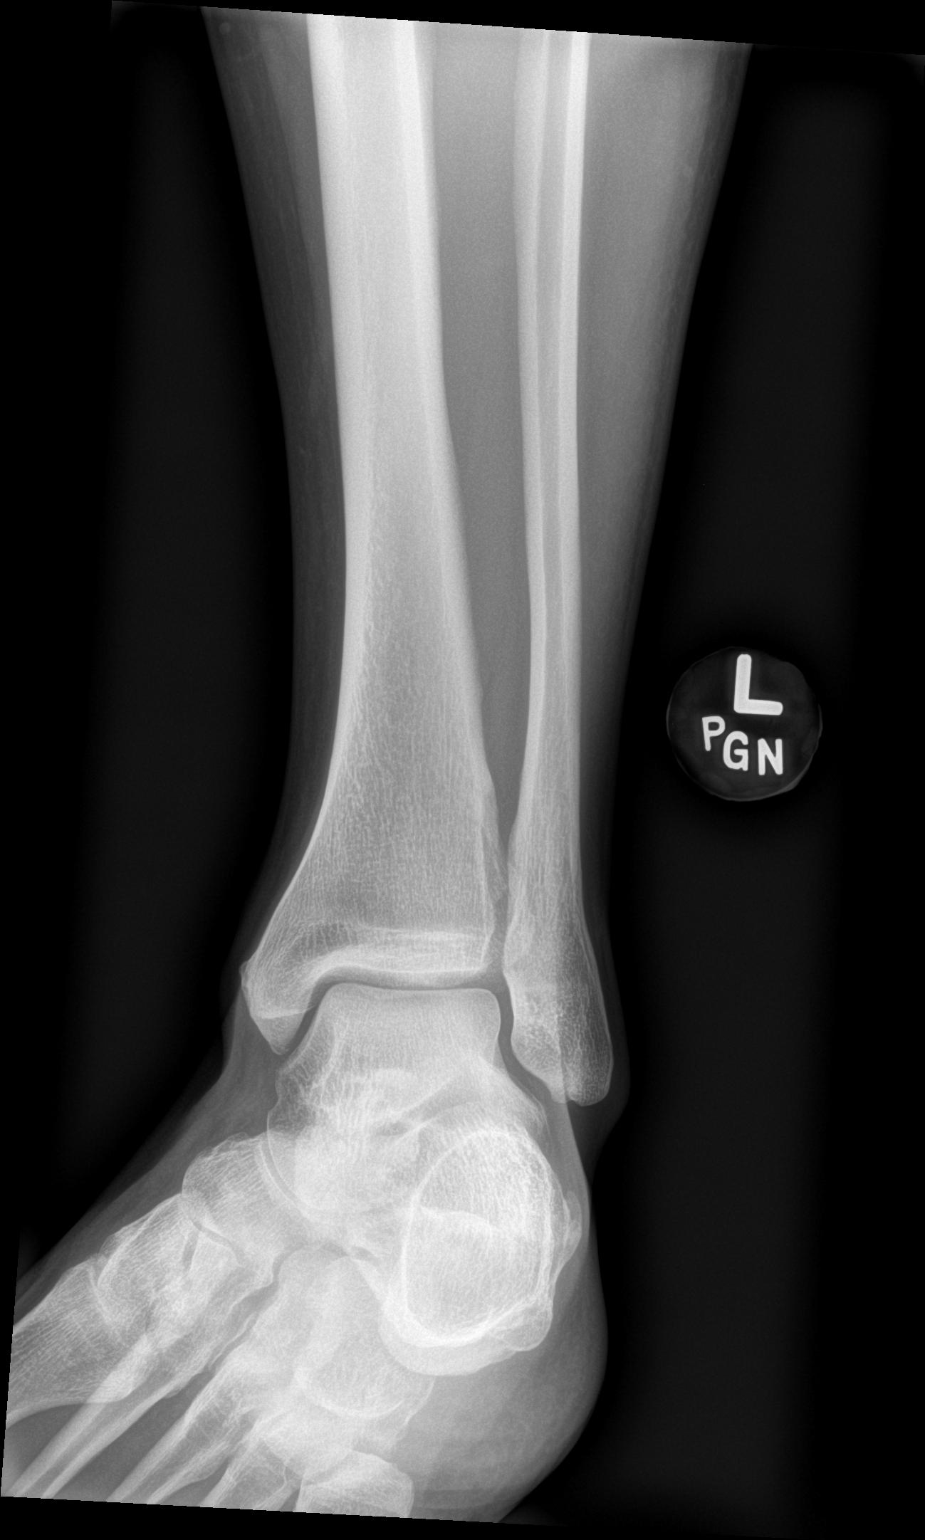

[ankle lat]
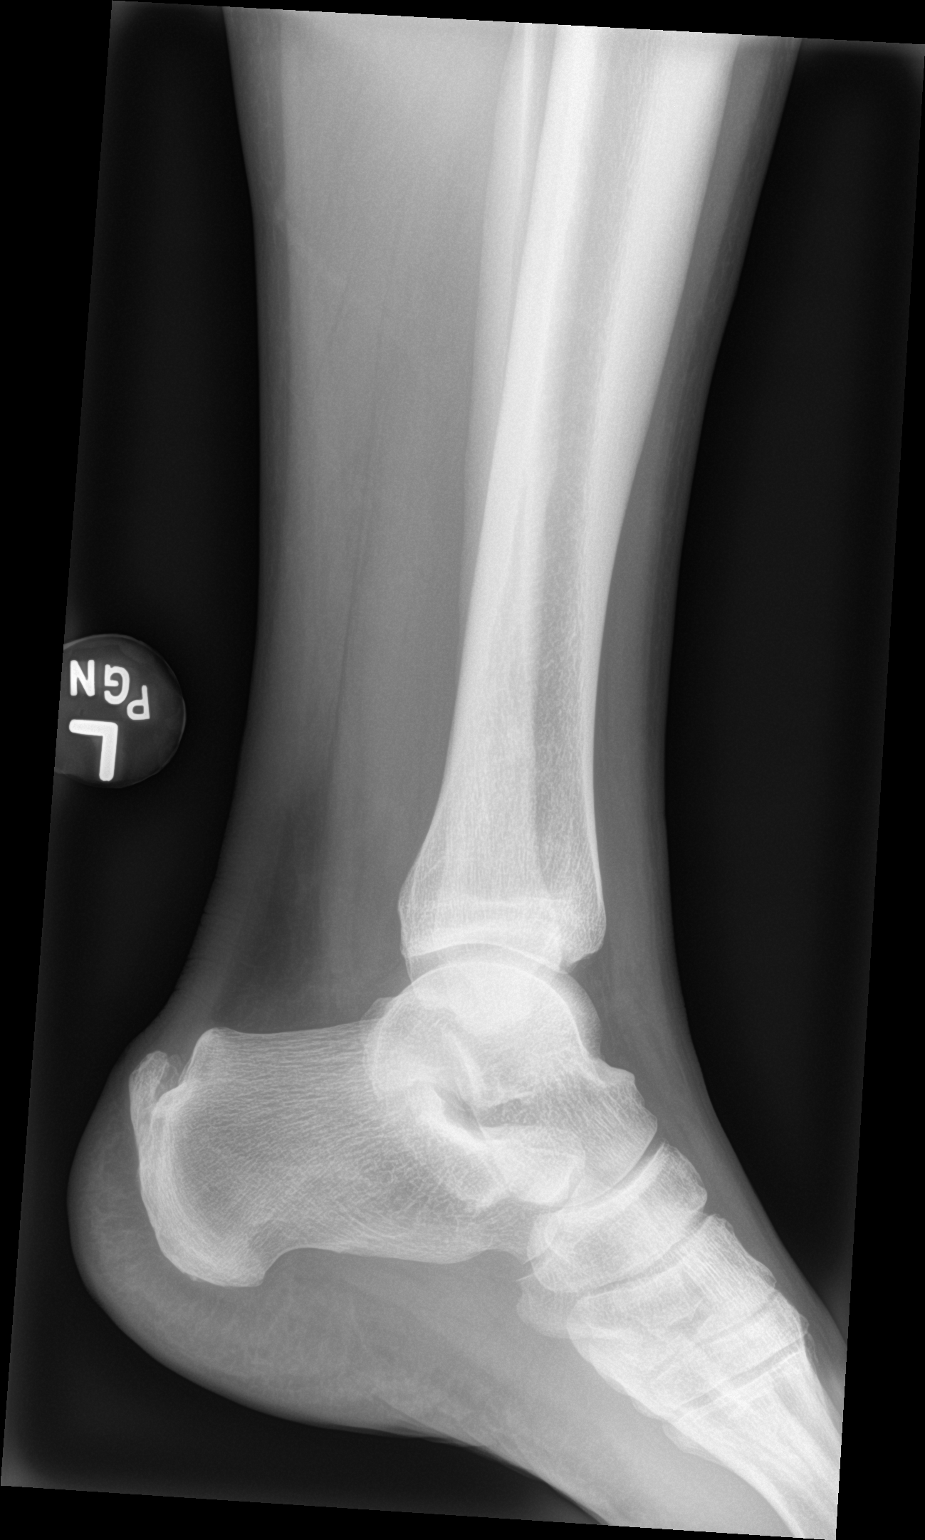

[3 of 3 positions shown; findings below may reference images not displayed]

FINDINGS: No acute bony or joint abnormality. No evidence of fracture or
dislocation. No radiopaque foreign body.
IMPRESSION: No acute abnormality.

## 2022-04-23 DIAGNOSIS — J3081 Allergic rhinitis due to animal (cat) (dog) hair and dander: Secondary | ICD-10-CM | POA: Diagnosis not present

## 2022-04-23 DIAGNOSIS — J3089 Other allergic rhinitis: Secondary | ICD-10-CM | POA: Diagnosis not present

## 2022-04-23 DIAGNOSIS — J301 Allergic rhinitis due to pollen: Secondary | ICD-10-CM | POA: Diagnosis not present

## 2022-04-29 DIAGNOSIS — J3081 Allergic rhinitis due to animal (cat) (dog) hair and dander: Secondary | ICD-10-CM | POA: Diagnosis not present

## 2022-04-29 DIAGNOSIS — J3089 Other allergic rhinitis: Secondary | ICD-10-CM | POA: Diagnosis not present

## 2022-04-29 DIAGNOSIS — J301 Allergic rhinitis due to pollen: Secondary | ICD-10-CM | POA: Diagnosis not present

## 2022-05-14 DIAGNOSIS — E785 Hyperlipidemia, unspecified: Secondary | ICD-10-CM | POA: Diagnosis not present

## 2022-05-14 DIAGNOSIS — E349 Endocrine disorder, unspecified: Secondary | ICD-10-CM | POA: Diagnosis not present

## 2022-05-14 DIAGNOSIS — E039 Hypothyroidism, unspecified: Secondary | ICD-10-CM | POA: Diagnosis not present

## 2022-05-14 DIAGNOSIS — Z125 Encounter for screening for malignant neoplasm of prostate: Secondary | ICD-10-CM | POA: Diagnosis not present

## 2022-05-15 DIAGNOSIS — J301 Allergic rhinitis due to pollen: Secondary | ICD-10-CM | POA: Diagnosis not present

## 2022-05-15 DIAGNOSIS — J3089 Other allergic rhinitis: Secondary | ICD-10-CM | POA: Diagnosis not present

## 2022-05-15 DIAGNOSIS — J3081 Allergic rhinitis due to animal (cat) (dog) hair and dander: Secondary | ICD-10-CM | POA: Diagnosis not present

## 2022-05-18 DIAGNOSIS — Z1331 Encounter for screening for depression: Secondary | ICD-10-CM | POA: Diagnosis not present

## 2022-05-18 DIAGNOSIS — Z0000 Encounter for general adult medical examination without abnormal findings: Secondary | ICD-10-CM | POA: Diagnosis not present

## 2022-05-18 DIAGNOSIS — E039 Hypothyroidism, unspecified: Secondary | ICD-10-CM | POA: Diagnosis not present

## 2022-05-18 DIAGNOSIS — R82998 Other abnormal findings in urine: Secondary | ICD-10-CM | POA: Diagnosis not present

## 2022-05-18 DIAGNOSIS — Z1339 Encounter for screening examination for other mental health and behavioral disorders: Secondary | ICD-10-CM | POA: Diagnosis not present

## 2022-05-25 DIAGNOSIS — J3089 Other allergic rhinitis: Secondary | ICD-10-CM | POA: Diagnosis not present

## 2022-05-25 DIAGNOSIS — J301 Allergic rhinitis due to pollen: Secondary | ICD-10-CM | POA: Diagnosis not present

## 2022-05-25 DIAGNOSIS — J3081 Allergic rhinitis due to animal (cat) (dog) hair and dander: Secondary | ICD-10-CM | POA: Diagnosis not present

## 2022-06-05 DIAGNOSIS — J3089 Other allergic rhinitis: Secondary | ICD-10-CM | POA: Diagnosis not present

## 2022-06-05 DIAGNOSIS — J301 Allergic rhinitis due to pollen: Secondary | ICD-10-CM | POA: Diagnosis not present

## 2022-06-05 DIAGNOSIS — J3081 Allergic rhinitis due to animal (cat) (dog) hair and dander: Secondary | ICD-10-CM | POA: Diagnosis not present

## 2022-06-10 DIAGNOSIS — J101 Influenza due to other identified influenza virus with other respiratory manifestations: Secondary | ICD-10-CM | POA: Diagnosis not present

## 2022-06-10 DIAGNOSIS — G43909 Migraine, unspecified, not intractable, without status migrainosus: Secondary | ICD-10-CM | POA: Diagnosis not present

## 2022-06-10 DIAGNOSIS — R0981 Nasal congestion: Secondary | ICD-10-CM | POA: Diagnosis not present

## 2022-06-10 DIAGNOSIS — R051 Acute cough: Secondary | ICD-10-CM | POA: Diagnosis not present

## 2022-06-10 DIAGNOSIS — R5383 Other fatigue: Secondary | ICD-10-CM | POA: Diagnosis not present

## 2022-06-10 DIAGNOSIS — Z1152 Encounter for screening for COVID-19: Secondary | ICD-10-CM | POA: Diagnosis not present

## 2022-06-18 DIAGNOSIS — G43909 Migraine, unspecified, not intractable, without status migrainosus: Secondary | ICD-10-CM | POA: Diagnosis not present

## 2022-06-18 DIAGNOSIS — R6883 Chills (without fever): Secondary | ICD-10-CM | POA: Diagnosis not present

## 2022-06-18 DIAGNOSIS — Z1152 Encounter for screening for COVID-19: Secondary | ICD-10-CM | POA: Diagnosis not present

## 2022-06-18 DIAGNOSIS — R051 Acute cough: Secondary | ICD-10-CM | POA: Diagnosis not present

## 2022-06-18 DIAGNOSIS — J329 Chronic sinusitis, unspecified: Secondary | ICD-10-CM | POA: Diagnosis not present

## 2022-06-18 DIAGNOSIS — R5383 Other fatigue: Secondary | ICD-10-CM | POA: Diagnosis not present

## 2022-07-07 DIAGNOSIS — J3089 Other allergic rhinitis: Secondary | ICD-10-CM | POA: Diagnosis not present

## 2022-07-07 DIAGNOSIS — J301 Allergic rhinitis due to pollen: Secondary | ICD-10-CM | POA: Diagnosis not present

## 2022-07-07 DIAGNOSIS — J3081 Allergic rhinitis due to animal (cat) (dog) hair and dander: Secondary | ICD-10-CM | POA: Diagnosis not present

## 2022-07-14 DIAGNOSIS — J3089 Other allergic rhinitis: Secondary | ICD-10-CM | POA: Diagnosis not present

## 2022-07-14 DIAGNOSIS — J301 Allergic rhinitis due to pollen: Secondary | ICD-10-CM | POA: Diagnosis not present

## 2022-07-14 DIAGNOSIS — J3081 Allergic rhinitis due to animal (cat) (dog) hair and dander: Secondary | ICD-10-CM | POA: Diagnosis not present

## 2022-07-24 DIAGNOSIS — J301 Allergic rhinitis due to pollen: Secondary | ICD-10-CM | POA: Diagnosis not present

## 2022-07-24 DIAGNOSIS — J3081 Allergic rhinitis due to animal (cat) (dog) hair and dander: Secondary | ICD-10-CM | POA: Diagnosis not present

## 2022-07-24 DIAGNOSIS — J3089 Other allergic rhinitis: Secondary | ICD-10-CM | POA: Diagnosis not present

## 2022-07-31 DIAGNOSIS — J3081 Allergic rhinitis due to animal (cat) (dog) hair and dander: Secondary | ICD-10-CM | POA: Diagnosis not present

## 2022-07-31 DIAGNOSIS — J301 Allergic rhinitis due to pollen: Secondary | ICD-10-CM | POA: Diagnosis not present

## 2022-07-31 DIAGNOSIS — J3089 Other allergic rhinitis: Secondary | ICD-10-CM | POA: Diagnosis not present

## 2022-07-31 DIAGNOSIS — H1045 Other chronic allergic conjunctivitis: Secondary | ICD-10-CM | POA: Diagnosis not present

## 2022-08-11 DIAGNOSIS — J301 Allergic rhinitis due to pollen: Secondary | ICD-10-CM | POA: Diagnosis not present

## 2022-08-11 DIAGNOSIS — J3089 Other allergic rhinitis: Secondary | ICD-10-CM | POA: Diagnosis not present

## 2022-08-11 DIAGNOSIS — J3081 Allergic rhinitis due to animal (cat) (dog) hair and dander: Secondary | ICD-10-CM | POA: Diagnosis not present

## 2022-08-27 DIAGNOSIS — J301 Allergic rhinitis due to pollen: Secondary | ICD-10-CM | POA: Diagnosis not present

## 2022-08-27 DIAGNOSIS — J3081 Allergic rhinitis due to animal (cat) (dog) hair and dander: Secondary | ICD-10-CM | POA: Diagnosis not present

## 2022-08-27 DIAGNOSIS — J3089 Other allergic rhinitis: Secondary | ICD-10-CM | POA: Diagnosis not present

## 2023-06-07 ENCOUNTER — Other Ambulatory Visit: Payer: Self-pay | Admitting: Internal Medicine

## 2023-06-07 DIAGNOSIS — E785 Hyperlipidemia, unspecified: Secondary | ICD-10-CM

## 2023-06-29 ENCOUNTER — Other Ambulatory Visit: Payer: BC Managed Care – PPO

## 2023-07-22 ENCOUNTER — Ambulatory Visit
Admission: RE | Admit: 2023-07-22 | Discharge: 2023-07-22 | Disposition: A | Discharge status: Home / Self Care | Payer: BC Managed Care – PPO | Source: Ambulatory Visit | Attending: Internal Medicine | Admitting: Internal Medicine

## 2023-07-22 DIAGNOSIS — E785 Hyperlipidemia, unspecified: Secondary | ICD-10-CM
